# Patient Record
Sex: Male | Born: 1965
Health system: Southern US, Community
[De-identification: ages and names within clinical notes are randomized; demographics above are authoritative.]

## PROBLEM LIST (undated history)

## (undated) DIAGNOSIS — E785 Hyperlipidemia, unspecified: Secondary | ICD-10-CM

## (undated) DIAGNOSIS — I10 Essential (primary) hypertension: Secondary | ICD-10-CM

## (undated) DIAGNOSIS — I255 Ischemic cardiomyopathy: Secondary | ICD-10-CM

## (undated) DIAGNOSIS — I2510 Atherosclerotic heart disease of native coronary artery without angina pectoris: Secondary | ICD-10-CM

## (undated) HISTORY — PX: OTHER SURGICAL HISTORY: SHX169

## (undated) HISTORY — DX: Atherosclerotic heart disease of native coronary artery without angina pectoris: I25.10

## (undated) HISTORY — DX: Ischemic cardiomyopathy: I25.5

## (undated) HISTORY — PX: ABDOMINAL SURGERY: SHX537

## (undated) HISTORY — DX: Hyperlipidemia, unspecified: E78.5

---

## 2009-01-10 ENCOUNTER — Emergency Department (HOSPITAL_COMMUNITY): Admission: EM | Admit: 2009-01-10 | Discharge: 2009-01-10 | Payer: Self-pay | Admitting: Emergency Medicine

## 2011-05-26 ENCOUNTER — Inpatient Hospital Stay (INDEPENDENT_AMBULATORY_CARE_PROVIDER_SITE_OTHER)
Admission: RE | Admit: 2011-05-26 | Discharge: 2011-05-26 | Disposition: A | Discharge status: Home / Self Care | Payer: PRIVATE HEALTH INSURANCE | Source: Ambulatory Visit | Attending: Emergency Medicine | Admitting: Emergency Medicine

## 2011-05-26 DIAGNOSIS — I10 Essential (primary) hypertension: Secondary | ICD-10-CM

## 2011-05-26 DIAGNOSIS — M799 Soft tissue disorder, unspecified: Secondary | ICD-10-CM

## 2011-05-26 DIAGNOSIS — K089 Disorder of teeth and supporting structures, unspecified: Secondary | ICD-10-CM

## 2011-06-19 ENCOUNTER — Emergency Department (INDEPENDENT_AMBULATORY_CARE_PROVIDER_SITE_OTHER)
Admission: EM | Admit: 2011-06-19 | Discharge: 2011-06-19 | Disposition: A | Discharge status: Home / Self Care | Payer: PRIVATE HEALTH INSURANCE | Source: Home / Self Care | Attending: Family Medicine | Admitting: Family Medicine

## 2011-06-19 ENCOUNTER — Encounter: Payer: Self-pay | Admitting: Emergency Medicine

## 2011-06-19 DIAGNOSIS — R51 Headache: Secondary | ICD-10-CM

## 2011-06-19 DIAGNOSIS — M542 Cervicalgia: Secondary | ICD-10-CM

## 2011-06-19 DIAGNOSIS — I10 Essential (primary) hypertension: Secondary | ICD-10-CM

## 2011-06-19 HISTORY — DX: Essential (primary) hypertension: I10

## 2011-06-19 LAB — POCT I-STAT, CHEM 8
Creatinine, Ser: 1.1 mg/dL (ref 0.50–1.35)
Hemoglobin: 14.6 g/dL (ref 13.0–17.0)
Potassium: 3.8 mEq/L (ref 3.5–5.1)
Sodium: 143 mEq/L (ref 135–145)

## 2011-06-19 MED ORDER — HYDROCHLOROTHIAZIDE 25 MG PO TABS: 25.0000 mg | ORAL_TABLET | Freq: Every day | ORAL | Status: DC

## 2011-06-19 MED ORDER — HYDROCODONE-ACETAMINOPHEN 5-325 MG PO TABS: ORAL_TABLET | ORAL | Status: DC

## 2011-06-19 NOTE — ED Provider Notes (Addendum)
History     CSN: [id number] Arrival date & time: 06/19/2011  3:40 PM   First MD Initiated Contact with Patient 06/19/11 1542      Chief Complaint  Patient presents with  . Torticollis    (Consider location/radiation/quality/duration/timing/severity/associated sxs/prior treatment) The history is provided by the patient.  patient reports 3 day hx  Of headache and neck pain. No fever. No cough or cold symptoms. Gaylyn Rong is throbbing. No blurred vision. No n/v. No weakness. Denies head injury   Past Medical History  Diagnosis Date  . Hypertension     History reviewed. No pertinent past surgical history.  History reviewed. No pertinent family history.  History  Substance Use Topics  . Smoking status: Current Everyday Smoker  . Smokeless tobacco: Not on file  . Alcohol Use: No      Review of Systems  Constitutional: Negative.   HENT: Positive for neck stiffness.   Respiratory: Negative.   Cardiovascular: Negative.   Gastrointestinal: Negative.   Neurological: Positive for headaches. Negative for dizziness, weakness and numbness.    Allergies  Aspirin  Home Medications   Current Outpatient Rx  Name Route Sig Dispense Refill  . HYDROCHLOROTHIAZIDE 25 MG PO TABS Oral Take 1 tablet (25 mg total) by mouth daily. 20 tablet 1  . HYDROCODONE-ACETAMINOPHEN 5-325 MG PO TABS  1-2 tabs every 8 hrs prn pain 10 tablet 0    BP 189/99  Pulse 75  Temp(Src) 98.3 F (36.8 C) (Oral)  Resp 20  SpO2 99%  Physical Exam  Nursing note and vitals reviewed. Constitutional: He is oriented to person, place, and time. He appears well-developed and well-nourished.  HENT:  Head: Normocephalic and atraumatic.  Right Ear: External ear normal.  Nose: Nose normal.  Mouth/Throat: Oropharynx is clear and moist.  Eyes: Conjunctivae and EOM are normal. Pupils are equal, round, and reactive to light.  Neck: Neck supple.       Pain with rom. Skin clear  Cardiovascular: Normal rate and regular  rhythm.   Pulmonary/Chest: Effort normal and breath sounds normal.  Abdominal: Soft. Bowel sounds are normal.  Musculoskeletal: Normal range of motion.  Neurological: He is alert and oriented to person, place, and time. He has normal reflexes. No cranial nerve deficit. Coordination normal.  Skin: Skin is warm and dry.  Psychiatric: He has a normal mood and affect.    ED Course  Procedures (including critical care time)  Labs Reviewed  POCT I-STAT, CHEM 8 - Abnormal; Notable for the following:    Glucose, Bld 131 (*)    All other components within normal limits  I-STAT, CHEM 8   No results found.   1. Hypertension   2. Headache   3. Neck pain       MDM          Randa Spike, MD 06/19/11 1659  Randa Spike, MD 06/19/11 1700
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
## 2012-05-20 ENCOUNTER — Encounter (HOSPITAL_COMMUNITY): Payer: Self-pay | Admitting: Emergency Medicine

## 2012-05-20 ENCOUNTER — Emergency Department (INDEPENDENT_AMBULATORY_CARE_PROVIDER_SITE_OTHER)
Admission: EM | Admit: 2012-05-20 | Discharge: 2012-05-20 | Disposition: A | Discharge status: Home / Self Care | Payer: Medicaid - Out of State | Source: Home / Self Care

## 2012-05-20 DIAGNOSIS — M549 Dorsalgia, unspecified: Secondary | ICD-10-CM

## 2012-05-20 DIAGNOSIS — M542 Cervicalgia: Secondary | ICD-10-CM

## 2012-05-20 MED ORDER — CYCLOBENZAPRINE HCL 10 MG PO TABS: 10.0000 mg | ORAL_TABLET | Freq: Two times a day (BID) | ORAL | Status: DC | PRN

## 2012-05-20 MED ORDER — METHYLPREDNISOLONE 4 MG PO KIT: PACK | ORAL | Status: DC

## 2012-05-20 MED ORDER — KETOROLAC TROMETHAMINE 60 MG/2ML IM SOLN
INTRAMUSCULAR | Status: AC
  Filled 2012-05-20: qty 2

## 2012-05-20 MED ORDER — KETOROLAC TROMETHAMINE 60 MG/2ML IM SOLN
60.0000 mg | Freq: Once | INTRAMUSCULAR | Status: AC
  Administered 2012-05-20: 60 mg via INTRAMUSCULAR

## 2012-05-20 MED ORDER — NAPROXEN 500 MG PO TABS: 500.0000 mg | ORAL_TABLET | Freq: Two times a day (BID) | ORAL | Status: DC

## 2012-05-20 NOTE — ED Notes (Signed)
Pt c/o chest pain that radiates to back most pain felt in chest with deep breathing. Symptoms have been constant for the past two weeks.  Muscle and joint stiffness. Pt denies sob

## 2012-05-20 NOTE — ED Provider Notes (Signed)
History     CSN: [id number]  Arrival date & time 05/20/12  1057   None     Chief Complaint  Patient presents with  . Chest Pain  . Back Pain    (Consider location/radiation/quality/duration/timing/severity/associated sxs/prior treatment) Patient is a 46 y.o. male presenting with back pain. The history is provided by the patient.  Back Pain  This is a recurrent problem. The current episode started more than 1 week ago. The problem occurs daily. The problem has not changed since onset.The pain is associated with no known injury. The pain is present in the lumbar spine and thoracic spine. The quality of the pain is described as aching. The pain does not radiate. The pain is moderate. The symptoms are aggravated by bending and certain positions. The pain is worse during the day. Stiffness is present in the morning. Associated symptoms include chest pain. Pertinent negatives include no bowel incontinence, no perianal numbness, no dysuria, no leg pain, no paresthesias, no tingling and no weakness. He has tried NSAIDs for the symptoms. The treatment provided mild relief. Risk factors include lack of exercise.  back pain body stiffness, central chest pain worse with movement for one week, advil taken with minor relief.  No illness, lifts for work-boxes.    Past Medical History  Diagnosis Date  . Hypertension     History reviewed. No pertinent past surgical history.  History reviewed. No pertinent family history.  History  Substance Use Topics  . Smoking status: Current Every Day Smoker  . Smokeless tobacco: Not on file  . Alcohol Use: No      Review of Systems  Constitutional: Negative.   Cardiovascular: Positive for chest pain.  Gastrointestinal: Negative for bowel incontinence.  Genitourinary: Negative for dysuria.  Musculoskeletal: Positive for back pain.  Neurological: Negative for tingling, weakness and paresthesias.    Allergies  Aspirin  Home Medications   Current  Outpatient Rx  Name Route Sig Dispense Refill  . CYCLOBENZAPRINE HCL 10 MG PO TABS Oral Take 1 tablet (10 mg total) by mouth 2 (two) times daily as needed for muscle spasms. 20 tablet 0  . HYDROCHLOROTHIAZIDE 25 MG PO TABS Oral Take 1 tablet (25 mg total) by mouth daily. 20 tablet 1  . HYDROCODONE-ACETAMINOPHEN 5-325 MG PO TABS  1-2 tabs every 8 hrs prn pain 10 tablet 0  . METHYLPREDNISOLONE 4 MG PO KIT  Take as directed 21 tablet 0  . NAPROXEN 500 MG PO TABS Oral Take 1 tablet (500 mg total) by mouth 2 (two) times daily. 30 tablet 0    BP 150/90  Pulse 64  Temp 98 F (36.7 C) (Oral)  Resp 18  SpO2 100%  Physical Exam  Nursing note and vitals reviewed. Constitutional: He is oriented to person, place, and time. Vital signs are normal. He appears well-developed and well-nourished. He is active and cooperative.  HENT:  Head: Normocephalic.  Eyes: Conjunctivae normal are normal. Pupils are equal, round, and reactive to light. No scleral icterus.  Neck: Trachea normal. Neck supple.  Cardiovascular: Normal rate and regular rhythm.   Pulmonary/Chest: Effort normal and breath sounds normal.  Musculoskeletal:       Cervical back: Normal.       Thoracic back: Normal.       Lumbar back: Normal.       Thoracic and lumbar paraspinal tenderness, straight leg raises negative.  Neurological: He is alert and oriented to person, place, and time. He has normal strength. No cranial nerve  deficit or sensory deficit. Coordination and gait normal. GCS eye subscore is 4. GCS verbal subscore is 5. GCS motor subscore is 6.       MAEx 4, strength equal bilaterally  Skin: Skin is warm and dry.  Psychiatric: He has a normal mood and affect. His speech is normal and behavior is normal. Judgment and thought content normal. Cognition and memory are normal.    ED Course  Procedures (including critical care time)  Labs Reviewed - No data to display No results found.   1. Neck pain   2. Back pain        MDM  toradol 60mg  Im administered.  Medication as prescribed.  Follow up with primary care provider for long term management.          Johnsie Kindred, NP 05/22/12 1744

## 2012-05-23 NOTE — ED Provider Notes (Signed)
Medical screening examination/treatment/procedure(s) were performed by non-physician practitioner and as supervising physician I was immediately available for consultation/collaboration.  Cate Oravec, M.D.   Adrick Kestler C Kevonta Phariss, MD 05/23/12 2144 

## 2013-12-22 ENCOUNTER — Emergency Department (INDEPENDENT_AMBULATORY_CARE_PROVIDER_SITE_OTHER)
Admission: EM | Admit: 2013-12-22 | Discharge: 2013-12-22 | Disposition: A | Discharge status: Home / Self Care | Payer: Self-pay | Source: Home / Self Care | Attending: Family Medicine | Admitting: Family Medicine

## 2013-12-22 ENCOUNTER — Encounter (HOSPITAL_COMMUNITY): Payer: Self-pay | Admitting: Emergency Medicine

## 2013-12-22 DIAGNOSIS — S060XAA Concussion with loss of consciousness status unknown, initial encounter: Secondary | ICD-10-CM

## 2013-12-22 DIAGNOSIS — S060X9A Concussion with loss of consciousness of unspecified duration, initial encounter: Secondary | ICD-10-CM

## 2013-12-22 DIAGNOSIS — S139XXA Sprain of joints and ligaments of unspecified parts of neck, initial encounter: Secondary | ICD-10-CM

## 2013-12-22 DIAGNOSIS — S134XXA Sprain of ligaments of cervical spine, initial encounter: Secondary | ICD-10-CM

## 2013-12-22 MED ORDER — HYDROCODONE-ACETAMINOPHEN 5-325 MG PO TABS: 1.0000 | ORAL_TABLET | Freq: Four times a day (QID) | ORAL | Status: DC | PRN

## 2013-12-22 MED ORDER — IBUPROFEN 800 MG PO TABS: 800.0000 mg | ORAL_TABLET | Freq: Three times a day (TID) | ORAL | Status: DC

## 2013-12-22 MED ORDER — CYCLOBENZAPRINE HCL 10 MG PO TABS: 10.0000 mg | ORAL_TABLET | Freq: Three times a day (TID) | ORAL | Status: DC | PRN
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
## 2013-12-26 NOTE — ED Provider Notes (Signed)
Medical screening examination/treatment/procedure(s) were performed by a resident physician or non-physician practitioner and as the supervising physician I was immediately available for consultation/collaboration.  Evan Corey, MD    Evan S Corey, MD 12/26/13 0926 

## 2014-05-15 ENCOUNTER — Emergency Department (HOSPITAL_COMMUNITY)
Admission: EM | Admit: 2014-05-15 | Discharge: 2014-05-15 | Disposition: A | Discharge status: Home / Self Care | Payer: Self-pay | Attending: Emergency Medicine | Admitting: Emergency Medicine

## 2014-05-15 ENCOUNTER — Encounter (HOSPITAL_COMMUNITY): Payer: Self-pay | Admitting: Emergency Medicine

## 2014-05-15 DIAGNOSIS — R05 Cough: Secondary | ICD-10-CM | POA: Insufficient documentation

## 2014-05-15 DIAGNOSIS — Z720 Tobacco use: Secondary | ICD-10-CM | POA: Insufficient documentation

## 2014-05-15 DIAGNOSIS — S3992XA Unspecified injury of lower back, initial encounter: Secondary | ICD-10-CM | POA: Insufficient documentation

## 2014-05-15 DIAGNOSIS — Z79899 Other long term (current) drug therapy: Secondary | ICD-10-CM | POA: Insufficient documentation

## 2014-05-15 DIAGNOSIS — Y9389 Activity, other specified: Secondary | ICD-10-CM | POA: Insufficient documentation

## 2014-05-15 DIAGNOSIS — I10 Essential (primary) hypertension: Secondary | ICD-10-CM | POA: Insufficient documentation

## 2014-05-15 DIAGNOSIS — S199XXA Unspecified injury of neck, initial encounter: Secondary | ICD-10-CM | POA: Insufficient documentation

## 2014-05-15 DIAGNOSIS — Y92410 Unspecified street and highway as the place of occurrence of the external cause: Secondary | ICD-10-CM | POA: Insufficient documentation

## 2014-05-15 DIAGNOSIS — Z791 Long term (current) use of non-steroidal anti-inflammatories (NSAID): Secondary | ICD-10-CM | POA: Insufficient documentation

## 2014-05-15 DIAGNOSIS — S4990XA Unspecified injury of shoulder and upper arm, unspecified arm, initial encounter: Secondary | ICD-10-CM | POA: Insufficient documentation

## 2014-05-15 DIAGNOSIS — M25511 Pain in right shoulder: Secondary | ICD-10-CM

## 2014-05-15 MED ORDER — OXYCODONE-ACETAMINOPHEN 5-325 MG PO TABS
2.0000 | ORAL_TABLET | Freq: Once | ORAL | Status: AC
  Administered 2014-05-15: 2 via ORAL
  Filled 2014-05-15: qty 2

## 2014-05-15 MED ORDER — DIAZEPAM 5 MG PO TABS: 5.0000 mg | ORAL_TABLET | Freq: Two times a day (BID) | ORAL | Status: DC

## 2014-05-15 NOTE — ED Notes (Signed)
After Pt received Pain meds and  does not want to wait for x-ray of RT shoulder. Pt reported he wanted to go to his medical DR for follow up.
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
## 2014-05-15 NOTE — Discharge Instructions (Signed)

## 2014-05-16 NOTE — Discharge Planning (Signed)
[facility] was unable to see the patient, [facility] orange card information will be sent to the address provided. °

## 2014-05-21 NOTE — ED Provider Notes (Signed)
Medical screening examination/treatment/procedure(s) were performed by non-physician practitioner and as supervising physician I was immediately available for consultation/collaboration.   EKG Interpretation None        Rolland PorterMark Wendell Nicoson, MD 05/21/14 [phone]

## 2015-01-16 ENCOUNTER — Emergency Department (HOSPITAL_COMMUNITY)
Admission: EM | Admit: 2015-01-16 | Discharge: 2015-01-16 | Disposition: A | Discharge status: Home / Self Care | Payer: Self-pay | Attending: Emergency Medicine | Admitting: Emergency Medicine

## 2015-01-16 ENCOUNTER — Encounter (HOSPITAL_COMMUNITY): Payer: Self-pay | Admitting: Emergency Medicine

## 2015-01-16 DIAGNOSIS — Z720 Tobacco use: Secondary | ICD-10-CM | POA: Insufficient documentation

## 2015-01-16 DIAGNOSIS — M542 Cervicalgia: Secondary | ICD-10-CM

## 2015-01-16 DIAGNOSIS — Y92410 Unspecified street and highway as the place of occurrence of the external cause: Secondary | ICD-10-CM | POA: Insufficient documentation

## 2015-01-16 DIAGNOSIS — T148XXA Other injury of unspecified body region, initial encounter: Secondary | ICD-10-CM

## 2015-01-16 DIAGNOSIS — Z79899 Other long term (current) drug therapy: Secondary | ICD-10-CM | POA: Insufficient documentation

## 2015-01-16 DIAGNOSIS — S299XXA Unspecified injury of thorax, initial encounter: Secondary | ICD-10-CM | POA: Insufficient documentation

## 2015-01-16 DIAGNOSIS — Y9389 Activity, other specified: Secondary | ICD-10-CM | POA: Insufficient documentation

## 2015-01-16 DIAGNOSIS — Y998 Other external cause status: Secondary | ICD-10-CM | POA: Insufficient documentation

## 2015-01-16 DIAGNOSIS — S199XXA Unspecified injury of neck, initial encounter: Secondary | ICD-10-CM | POA: Insufficient documentation

## 2015-01-16 DIAGNOSIS — I10 Essential (primary) hypertension: Secondary | ICD-10-CM | POA: Insufficient documentation

## 2015-01-16 DIAGNOSIS — T148 Other injury of unspecified body region: Secondary | ICD-10-CM | POA: Insufficient documentation

## 2015-01-16 MED ORDER — CYCLOBENZAPRINE HCL 10 MG PO TABS: 10.0000 mg | ORAL_TABLET | Freq: Two times a day (BID) | ORAL | Status: DC | PRN

## 2015-01-16 MED ORDER — IBUPROFEN 800 MG PO TABS: 800.0000 mg | ORAL_TABLET | Freq: Three times a day (TID) | ORAL | Status: DC
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
## 2015-01-16 NOTE — Discharge Instructions (Signed)
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
## 2015-01-16 NOTE — ED Notes (Signed)
Lower back, neck and entire spine.  Right leg pain in upper leg/hip.

## 2018-04-03 ENCOUNTER — Telehealth (HOSPITAL_COMMUNITY): Payer: Self-pay | Admitting: *Deleted

## 2018-04-03 ENCOUNTER — Ambulatory Visit (HOSPITAL_COMMUNITY)
Admission: EM | Admit: 2018-04-03 | Discharge: 2018-04-03 | Disposition: A | Discharge status: Home / Self Care | Payer: Self-pay | Attending: Family Medicine | Admitting: Family Medicine

## 2018-04-03 ENCOUNTER — Encounter (HOSPITAL_COMMUNITY): Payer: Self-pay | Admitting: *Deleted

## 2018-04-03 ENCOUNTER — Other Ambulatory Visit: Payer: Self-pay

## 2018-04-03 DIAGNOSIS — M545 Low back pain, unspecified: Secondary | ICD-10-CM

## 2018-04-03 DIAGNOSIS — M546 Pain in thoracic spine: Secondary | ICD-10-CM

## 2018-04-03 MED ORDER — MELOXICAM 7.5 MG PO TABS: 7.5000 mg | ORAL_TABLET | Freq: Every day | ORAL | 0 refills | Status: DC

## 2018-04-03 MED ORDER — KETOROLAC TROMETHAMINE 30 MG/ML IJ SOLN
INTRAMUSCULAR | Status: AC
  Filled 2018-04-03: qty 1

## 2018-04-03 MED ORDER — KETOROLAC TROMETHAMINE 30 MG/ML IJ SOLN
30.0000 mg | Freq: Once | INTRAMUSCULAR | Status: AC
  Administered 2018-04-03: 30 mg via INTRAMUSCULAR

## 2018-04-03 MED ORDER — METHOCARBAMOL 500 MG PO TABS: 500.0000 mg | ORAL_TABLET | Freq: Two times a day (BID) | ORAL | 0 refills | Status: DC

## 2018-04-03 NOTE — Discharge Instructions (Signed)
No alarming signs on your exam. Toradol injection in office today. Start Mobic. Do not take ibuprofen (motrin/advil)/ naproxen (aleve) while on mobic. Robaxin as needed, this can make you drowsy, so do not take if you are going to drive, operate heavy machinery, or make important decisions. Ice/heat compresses as needed. This can take up to 3-4 weeks to completely resolve, but you should be feeling better each week. Follow up here or with PCP if symptoms worsen, changes for reevaluation.   Back  If experience numbness/tingling of the inner thighs, loss of bladder or bowel control, go to the emergency department for evaluation.   Head If experiencing worsening of symptoms, headache/blurry vision, nausea/vomiting, confusion/altered mental status, dizziness, weakness, passing out, imbalance, go to the emergency department for further evaluation.
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
## 2019-11-04 ENCOUNTER — Ambulatory Visit (HOSPITAL_COMMUNITY)
Admission: EM | Admit: 2019-11-04 | Discharge: 2019-11-04 | Disposition: A | Discharge status: Home / Self Care | Payer: Self-pay | Attending: Family Medicine | Admitting: Family Medicine

## 2019-11-04 ENCOUNTER — Other Ambulatory Visit: Payer: Self-pay

## 2019-11-04 ENCOUNTER — Encounter (HOSPITAL_COMMUNITY): Payer: Self-pay

## 2019-11-04 DIAGNOSIS — M542 Cervicalgia: Secondary | ICD-10-CM

## 2019-11-04 DIAGNOSIS — S46811A Strain of other muscles, fascia and tendons at shoulder and upper arm level, right arm, initial encounter: Secondary | ICD-10-CM

## 2019-11-04 DIAGNOSIS — M546 Pain in thoracic spine: Secondary | ICD-10-CM

## 2019-11-04 MED ORDER — CYCLOBENZAPRINE HCL 10 MG PO TABS: 10.0000 mg | ORAL_TABLET | Freq: Two times a day (BID) | ORAL | 0 refills | Status: DC | PRN

## 2019-11-04 MED ORDER — KETOROLAC TROMETHAMINE 30 MG/ML IJ SOLN
INTRAMUSCULAR | Status: AC
  Filled 2019-11-04: qty 1

## 2019-11-04 MED ORDER — KETOROLAC TROMETHAMINE 30 MG/ML IJ SOLN
30.0000 mg | Freq: Once | INTRAMUSCULAR | Status: AC
  Administered 2019-11-04: 30 mg via INTRAMUSCULAR
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
## 2019-12-05 ENCOUNTER — Other Ambulatory Visit: Payer: Self-pay

## 2019-12-05 ENCOUNTER — Encounter (HOSPITAL_COMMUNITY): Payer: Self-pay | Admitting: *Deleted

## 2019-12-05 ENCOUNTER — Inpatient Hospital Stay (HOSPITAL_COMMUNITY)
Admission: EM | Admit: 2019-12-05 | Discharge: 2019-12-07 | DRG: 247 | Disposition: A | Discharge status: Home / Self Care | Payer: Self-pay | Attending: Interventional Cardiology | Admitting: Interventional Cardiology

## 2019-12-05 ENCOUNTER — Emergency Department (HOSPITAL_COMMUNITY): Payer: Self-pay

## 2019-12-05 DIAGNOSIS — Z955 Presence of coronary angioplasty implant and graft: Secondary | ICD-10-CM

## 2019-12-05 DIAGNOSIS — Z791 Long term (current) use of non-steroidal anti-inflammatories (NSAID): Secondary | ICD-10-CM

## 2019-12-05 DIAGNOSIS — Z20822 Contact with and (suspected) exposure to covid-19: Secondary | ICD-10-CM | POA: Diagnosis present

## 2019-12-05 DIAGNOSIS — I10 Essential (primary) hypertension: Secondary | ICD-10-CM | POA: Diagnosis present

## 2019-12-05 DIAGNOSIS — E785 Hyperlipidemia, unspecified: Secondary | ICD-10-CM | POA: Diagnosis present

## 2019-12-05 DIAGNOSIS — Z833 Family history of diabetes mellitus: Secondary | ICD-10-CM

## 2019-12-05 DIAGNOSIS — I2510 Atherosclerotic heart disease of native coronary artery without angina pectoris: Secondary | ICD-10-CM | POA: Diagnosis present

## 2019-12-05 DIAGNOSIS — R7303 Prediabetes: Secondary | ICD-10-CM | POA: Diagnosis present

## 2019-12-05 DIAGNOSIS — Z79899 Other long term (current) drug therapy: Secondary | ICD-10-CM

## 2019-12-05 DIAGNOSIS — Z886 Allergy status to analgesic agent status: Secondary | ICD-10-CM

## 2019-12-05 DIAGNOSIS — F17210 Nicotine dependence, cigarettes, uncomplicated: Secondary | ICD-10-CM | POA: Diagnosis present

## 2019-12-05 DIAGNOSIS — I213 ST elevation (STEMI) myocardial infarction of unspecified site: Secondary | ICD-10-CM

## 2019-12-05 DIAGNOSIS — I255 Ischemic cardiomyopathy: Secondary | ICD-10-CM | POA: Diagnosis present

## 2019-12-05 DIAGNOSIS — Z720 Tobacco use: Secondary | ICD-10-CM

## 2019-12-05 DIAGNOSIS — I2129 ST elevation (STEMI) myocardial infarction involving other sites: Principal | ICD-10-CM | POA: Diagnosis present

## 2019-12-05 LAB — BASIC METABOLIC PANEL
Anion gap: 9 (ref 5–15)
BUN: 13 mg/dL (ref 6–20)
CO2: 32 mmol/L (ref 22–32)
Calcium: 9.3 mg/dL (ref 8.9–10.3)
Chloride: 102 mmol/L (ref 98–111)
Creatinine, Ser: 1.07 mg/dL (ref 0.61–1.24)
GFR calc Af Amer: >60 mL/min (ref >60)
GFR calc non Af Amer: >60 mL/min (ref >60)
Glucose, Bld: 112 mg/dL — ABNORMAL HIGH (ref 70–99)
Potassium: 3.9 mmol/L (ref 3.5–5.1)
Sodium: 143 mmol/L (ref 135–145)

## 2019-12-05 LAB — TROPONIN I (HIGH SENSITIVITY): Troponin I (High Sensitivity): 233 ng/L (ref <18)

## 2019-12-05 LAB — CBC
HCT: 41.3 % (ref 39.0–52.0)
Hemoglobin: 13.7 g/dL (ref 13.0–17.0)
MCH: 27.3 pg (ref 26.0–34.0)
MCHC: 33.2 g/dL (ref 30.0–36.0)
MCV: 82.4 fL (ref 80.0–100.0)
Platelets: 310 10*3/uL (ref 150–400)
RBC: 5.01 MIL/uL (ref 4.22–5.81)
RDW: 15.8 % — ABNORMAL HIGH (ref 11.5–15.5)
WBC: 5.7 10*3/uL (ref 4.0–10.5)
nRBC: 0 % (ref 0.0–0.2)

## 2019-12-05 MED ORDER — ASPIRIN 81 MG PO CHEW
243.0000 mg | CHEWABLE_TABLET | Freq: Once | ORAL | Status: AC
  Administered 2019-12-05: 243 mg via ORAL
  Filled 2019-12-05: qty 3

## 2019-12-05 MED ORDER — NITROGLYCERIN 0.4 MG SL SUBL: 0.4000 mg | SUBLINGUAL_TABLET | SUBLINGUAL | Status: DC | PRN

## 2019-12-05 MED ORDER — HEPARIN SODIUM (PORCINE) 5000 UNIT/ML IJ SOLN
4000.0000 [IU] | Freq: Once | INTRAMUSCULAR | Status: AC
  Administered 2019-12-05: 4000 [IU] via INTRAVENOUS
  Filled 2019-12-05: qty 1

## 2019-12-05 MED ORDER — SODIUM CHLORIDE 0.9% FLUSH
3.0000 mL | Freq: Once | INTRAVENOUS | Status: AC
  Administered 2019-12-05: 3 mL via INTRAVENOUS

## 2019-12-05 MED ORDER — HEPARIN (PORCINE) 25000 UT/250ML-% IV SOLN
1200.0000 [IU]/h | INTRAVENOUS | Status: DC
  Administered 2019-12-05: 1200 [IU]/h via INTRAVENOUS
  Filled 2019-12-05: qty 250

## 2019-12-05 NOTE — ED Triage Notes (Signed)
Pt says that he has been having shortness of breath, feels like his heart is racing,  and generalized chest pain that started last night.
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
## 2019-12-05 NOTE — Progress Notes (Signed)
ANTICOAGULATION CONSULT NOTE - Initial Consult  Pharmacy Consult for Heparin Indication: chest pain/ACS  Allergies  Allergen Reactions  . Aspirin     Hives     Patient Measurements: Height: 5\' 10"  (177.8 cm) Weight: 97.5 kg (215 lb) IBW/kg (Calculated) : 73 Heparin Dosing Weight:   Vital Signs: Temp: 98.5 F (36.9 C) (05/03 2121) Temp Source: Oral (05/03 2121) BP: 172/93 (05/03 2340) Pulse Rate: 63 (05/03 2340)  Labs: Recent Labs    12/05/19 2133  HGB 13.7  HCT 41.3  PLT 310  CREATININE 1.07  TROPONINIHS 233*    Estimated Creatinine Clearance: 92.4 mL/min (by C-G formula based on SCr of 1.07 mg/dL).   Medical History: Past Medical History:  Diagnosis Date  . Hypertension     Medications:  (Not in a hospital admission)  Infusions:  . [START ON 12/06/2019] heparin      Assessment: Patient with ACS.  No oral anticoagulants noted on med rec.  Baseline coags ordered/drawn.  Heparin 4000 units iv bolus ordered/charted.    Goal of Therapy:  Heparin level 0.3-0.7 units/ml Monitor platelets by anticoagulation protocol: Yes   Plan:  Heparin 1200 units/hr CBC daily Heparin level at 0800 5/4  02/05/2020 [place] 12/05/2019,11:47 PM

## 2019-12-06 ENCOUNTER — Inpatient Hospital Stay (HOSPITAL_COMMUNITY): Payer: Self-pay

## 2019-12-06 ENCOUNTER — Encounter (HOSPITAL_COMMUNITY)
Admission: EM | Disposition: A | Discharge status: Home / Self Care | Payer: Self-pay | Source: Home / Self Care | Attending: Interventional Cardiology

## 2019-12-06 ENCOUNTER — Other Ambulatory Visit: Payer: Self-pay

## 2019-12-06 DIAGNOSIS — I2129 ST elevation (STEMI) myocardial infarction involving other sites: Principal | ICD-10-CM

## 2019-12-06 DIAGNOSIS — I213 ST elevation (STEMI) myocardial infarction of unspecified site: Secondary | ICD-10-CM

## 2019-12-06 DIAGNOSIS — I2510 Atherosclerotic heart disease of native coronary artery without angina pectoris: Secondary | ICD-10-CM

## 2019-12-06 DIAGNOSIS — Z720 Tobacco use: Secondary | ICD-10-CM

## 2019-12-06 DIAGNOSIS — I2121 ST elevation (STEMI) myocardial infarction involving left circumflex coronary artery: Secondary | ICD-10-CM

## 2019-12-06 DIAGNOSIS — I10 Essential (primary) hypertension: Secondary | ICD-10-CM

## 2019-12-06 HISTORY — PX: CORONARY STENT INTERVENTION: CATH118234

## 2019-12-06 HISTORY — PX: CORONARY BALLOON ANGIOPLASTY: CATH118233

## 2019-12-06 HISTORY — PX: LEFT HEART CATH AND CORONARY ANGIOGRAPHY: CATH118249

## 2019-12-06 HISTORY — PX: CORONARY/GRAFT ACUTE MI REVASCULARIZATION: CATH118305

## 2019-12-06 LAB — HIV ANTIBODY (ROUTINE TESTING W REFLEX): HIV Screen 4th Generation wRfx: NONREACTIVE

## 2019-12-06 LAB — CBC
HCT: 40.7 % (ref 39.0–52.0)
Hemoglobin: 13.3 g/dL (ref 13.0–17.0)
MCH: 26.8 pg (ref 26.0–34.0)
MCHC: 32.7 g/dL (ref 30.0–36.0)
MCV: 82.1 fL (ref 80.0–100.0)
Platelets: 303 10*3/uL (ref 150–400)
RBC: 4.96 MIL/uL (ref 4.22–5.81)
RDW: 15.7 % — ABNORMAL HIGH (ref 11.5–15.5)
WBC: 7.7 10*3/uL (ref 4.0–10.5)
nRBC: 0 % (ref 0.0–0.2)

## 2019-12-06 LAB — RESPIRATORY PANEL BY RT PCR (FLU A&B, COVID)
Influenza A by PCR: NEGATIVE
Influenza B by PCR: NEGATIVE
SARS Coronavirus 2 by RT PCR: NEGATIVE

## 2019-12-06 LAB — LIPID PANEL
Cholesterol: 214 mg/dL — ABNORMAL HIGH (ref 0–200)
HDL: 45 mg/dL (ref >40)
LDL Cholesterol: 164 mg/dL — ABNORMAL HIGH (ref 0–99)
Total CHOL/HDL Ratio: 4.8 RATIO
Triglycerides: 27 mg/dL (ref <150)
VLDL: 5 mg/dL (ref 0–40)

## 2019-12-06 LAB — TROPONIN I (HIGH SENSITIVITY)
Troponin I (High Sensitivity): 141 ng/L (ref <18)
Troponin I (High Sensitivity): >27000 ng/L (ref <18)

## 2019-12-06 LAB — BASIC METABOLIC PANEL
Anion gap: 8 (ref 5–15)
BUN: 9 mg/dL (ref 6–20)
CO2: 29 mmol/L (ref 22–32)
Calcium: 9.1 mg/dL (ref 8.9–10.3)
Chloride: 102 mmol/L (ref 98–111)
Creatinine, Ser: 1.01 mg/dL (ref 0.61–1.24)
GFR calc Af Amer: >60 mL/min (ref >60)
GFR calc non Af Amer: >60 mL/min (ref >60)
Glucose, Bld: 132 mg/dL — ABNORMAL HIGH (ref 70–99)
Potassium: 3.7 mmol/L (ref 3.5–5.1)
Sodium: 139 mmol/L (ref 135–145)

## 2019-12-06 LAB — MRSA PCR SCREENING: MRSA by PCR: NEGATIVE

## 2019-12-06 LAB — PROTIME-INR
INR: 1 (ref 0.8–1.2)
INR: 1.1 (ref 0.8–1.2)
Prothrombin Time: 12.4 seconds (ref 11.4–15.2)
Prothrombin Time: 13.5 seconds (ref 11.4–15.2)

## 2019-12-06 LAB — MAGNESIUM: Magnesium: 2 mg/dL (ref 1.7–2.4)

## 2019-12-06 LAB — ECHOCARDIOGRAM COMPLETE
Height: 70 in
Weight: 3280.44 oz

## 2019-12-06 LAB — BRAIN NATRIURETIC PEPTIDE: B Natriuretic Peptide: 21.9 pg/mL (ref 0.0–100.0)

## 2019-12-06 LAB — POCT ACTIVATED CLOTTING TIME
Activated Clotting Time: 279 seconds
Activated Clotting Time: 296 seconds

## 2019-12-06 LAB — D-DIMER, QUANTITATIVE: D-Dimer, Quant: 0.54 ug/mL-FEU — ABNORMAL HIGH (ref 0.00–0.50)

## 2019-12-06 LAB — HEMOGLOBIN A1C
Hgb A1c MFr Bld: 6.2 % — ABNORMAL HIGH (ref 4.8–5.6)
Mean Plasma Glucose: 131.24 mg/dL

## 2019-12-06 LAB — APTT: aPTT: 26 seconds (ref 24–36)

## 2019-12-06 SURGERY — CORONARY/GRAFT ACUTE MI REVASCULARIZATION
Anesthesia: LOCAL

## 2019-12-06 MED ORDER — CHLORHEXIDINE GLUCONATE CLOTH 2 % EX PADS
6.0000 | MEDICATED_PAD | Freq: Every day | CUTANEOUS | Status: DC
  Administered 2019-12-06: 22:00:00 6 via TOPICAL

## 2019-12-06 MED ORDER — NITROGLYCERIN 1 MG/10 ML FOR IR/CATH LAB
INTRA_ARTERIAL | Status: AC
  Filled 2019-12-06: qty 10

## 2019-12-06 MED ORDER — ONDANSETRON HCL 4 MG/2ML IJ SOLN: 4.0000 mg | Freq: Four times a day (QID) | INTRAMUSCULAR | Status: DC | PRN

## 2019-12-06 MED ORDER — SODIUM CHLORIDE 0.9 % IV SOLN: 250.0000 mL | INTRAVENOUS | Status: DC | PRN

## 2019-12-06 MED ORDER — FENTANYL CITRATE (PF) 100 MCG/2ML IJ SOLN
INTRAMUSCULAR | Status: AC
  Filled 2019-12-06: qty 2

## 2019-12-06 MED ORDER — ASPIRIN EC 81 MG PO TBEC
81.0000 mg | DELAYED_RELEASE_TABLET | Freq: Every day | ORAL | Status: DC
  Administered 2019-12-06 – 2019-12-07 (×2): 81 mg via ORAL
  Filled 2019-12-06 (×2): qty 1

## 2019-12-06 MED ORDER — METOPROLOL TARTRATE 12.5 MG HALF TABLET
12.5000 mg | ORAL_TABLET | Freq: Two times a day (BID) | ORAL | Status: DC
  Administered 2019-12-06 – 2019-12-07 (×3): 12.5 mg via ORAL
  Filled 2019-12-06 (×3): qty 1

## 2019-12-06 MED ORDER — LISINOPRIL 5 MG PO TABS
5.0000 mg | ORAL_TABLET | Freq: Every day | ORAL | Status: DC
  Administered 2019-12-06: 12:00:00 5 mg via ORAL
  Filled 2019-12-06: qty 1

## 2019-12-06 MED ORDER — HEPARIN SODIUM (PORCINE) 1000 UNIT/ML IJ SOLN
INTRAMUSCULAR | Status: DC | PRN
  Administered 2019-12-06: 8000 [IU] via INTRAVENOUS

## 2019-12-06 MED ORDER — PANTOPRAZOLE SODIUM 40 MG PO TBEC
40.0000 mg | DELAYED_RELEASE_TABLET | Freq: Every day | ORAL | Status: DC
  Administered 2019-12-06 – 2019-12-07 (×2): 40 mg via ORAL
  Filled 2019-12-06 (×2): qty 1

## 2019-12-06 MED ORDER — LIDOCAINE HCL (PF) 1 % IJ SOLN
INTRAMUSCULAR | Status: AC
  Filled 2019-12-06: qty 30

## 2019-12-06 MED ORDER — HYDROCODONE-ACETAMINOPHEN 5-325 MG PO TABS
1.0000 | ORAL_TABLET | Freq: Three times a day (TID) | ORAL | Status: DC | PRN
  Administered 2019-12-06 – 2019-12-07 (×3): 1 via ORAL
  Filled 2019-12-06 (×3): qty 1

## 2019-12-06 MED ORDER — ACETAMINOPHEN 325 MG PO TABS
650.0000 mg | ORAL_TABLET | ORAL | Status: DC | PRN
  Administered 2019-12-06 (×3): 650 mg via ORAL

## 2019-12-06 MED ORDER — TIROFIBAN HCL IN NACL 5-0.9 MG/100ML-% IV SOLN
INTRAVENOUS | Status: DC | PRN
  Administered 2019-12-06: 0.15 ug/kg/min via INTRAVENOUS

## 2019-12-06 MED ORDER — TIROFIBAN HCL IN NACL 5-0.9 MG/100ML-% IV SOLN
INTRAVENOUS | Status: AC
  Filled 2019-12-06: qty 100

## 2019-12-06 MED ORDER — SODIUM CHLORIDE 0.9 % IV SOLN
INTRAVENOUS | Status: AC | PRN
  Administered 2019-12-06: 10 mL/h via INTRAVENOUS

## 2019-12-06 MED ORDER — VERAPAMIL HCL 2.5 MG/ML IV SOLN
INTRAVENOUS | Status: AC
  Filled 2019-12-06: qty 2

## 2019-12-06 MED ORDER — FENTANYL CITRATE (PF) 100 MCG/2ML IJ SOLN
INTRAMUSCULAR | Status: DC | PRN
  Administered 2019-12-06 (×2): 25 ug via INTRAVENOUS

## 2019-12-06 MED ORDER — NITROGLYCERIN 0.4 MG SL SUBL
0.4000 mg | SUBLINGUAL_TABLET | SUBLINGUAL | Status: DC | PRN
  Administered 2019-12-06 (×2): 0.4 mg via SUBLINGUAL
  Filled 2019-12-06: qty 1

## 2019-12-06 MED ORDER — NICOTINE 21 MG/24HR TD PT24
21.0000 mg | MEDICATED_PATCH | Freq: Every day | TRANSDERMAL | Status: DC
  Administered 2019-12-06 – 2019-12-07 (×2): 21 mg via TRANSDERMAL
  Filled 2019-12-06 (×2): qty 1

## 2019-12-06 MED ORDER — TICAGRELOR 90 MG PO TABS
90.0000 mg | ORAL_TABLET | Freq: Two times a day (BID) | ORAL | Status: DC
  Administered 2019-12-06 – 2019-12-07 (×3): 90 mg via ORAL
  Filled 2019-12-06 (×3): qty 1

## 2019-12-06 MED ORDER — TIROFIBAN (AGGRASTAT) BOLUS VIA INFUSION
INTRAVENOUS | Status: DC | PRN
  Administered 2019-12-06: 01:00:00 2437.5 ug via INTRAVENOUS

## 2019-12-06 MED ORDER — ATORVASTATIN CALCIUM 80 MG PO TABS: 80.0000 mg | ORAL_TABLET | Freq: Every day | ORAL | Status: DC

## 2019-12-06 MED ORDER — IOHEXOL 350 MG/ML SOLN
INTRAVENOUS | Status: DC | PRN
  Administered 2019-12-06: 01:00:00 120 mL via INTRA_ARTERIAL

## 2019-12-06 MED ORDER — HYDRALAZINE HCL 20 MG/ML IJ SOLN: 10.0000 mg | INTRAMUSCULAR | Status: AC | PRN

## 2019-12-06 MED ORDER — LISINOPRIL 10 MG PO TABS
10.0000 mg | ORAL_TABLET | Freq: Every day | ORAL | Status: DC
  Administered 2019-12-07: 10:00:00 10 mg via ORAL
  Filled 2019-12-06: qty 1

## 2019-12-06 MED ORDER — VERAPAMIL HCL 2.5 MG/ML IV SOLN
INTRAVENOUS | Status: DC | PRN
  Administered 2019-12-06: 10 mL via INTRA_ARTERIAL

## 2019-12-06 MED ORDER — LIDOCAINE HCL (PF) 1 % IJ SOLN
INTRAMUSCULAR | Status: DC | PRN
  Administered 2019-12-06: 2 mL

## 2019-12-06 MED ORDER — NITROGLYCERIN 1 MG/10 ML FOR IR/CATH LAB
INTRA_ARTERIAL | Status: DC | PRN
  Administered 2019-12-06: 200 ug via INTRACORONARY
  Administered 2019-12-06: 200 ug via INTRA_ARTERIAL

## 2019-12-06 MED ORDER — ATORVASTATIN CALCIUM 80 MG PO TABS
80.0000 mg | ORAL_TABLET | Freq: Every day | ORAL | Status: DC
  Administered 2019-12-06 – 2019-12-07 (×2): 80 mg via ORAL
  Filled 2019-12-06 (×2): qty 1

## 2019-12-06 MED ORDER — POTASSIUM CHLORIDE CRYS ER 20 MEQ PO TBCR
40.0000 meq | EXTENDED_RELEASE_TABLET | Freq: Once | ORAL | Status: AC
  Administered 2019-12-06: 11:00:00 40 meq via ORAL
  Filled 2019-12-06: qty 2

## 2019-12-06 MED ORDER — ACETAMINOPHEN 325 MG PO TABS
650.0000 mg | ORAL_TABLET | ORAL | Status: DC | PRN
  Filled 2019-12-06 (×3): qty 2

## 2019-12-06 MED ORDER — LABETALOL HCL 5 MG/ML IV SOLN: 10.0000 mg | INTRAVENOUS | Status: AC | PRN

## 2019-12-06 MED ORDER — HEPARIN SODIUM (PORCINE) 1000 UNIT/ML IJ SOLN
INTRAMUSCULAR | Status: AC
  Filled 2019-12-06: qty 1

## 2019-12-06 MED ORDER — HEPARIN (PORCINE) IN NACL 1000-0.9 UT/500ML-% IV SOLN
INTRAVENOUS | Status: DC | PRN
  Administered 2019-12-06 (×2): 500 mL

## 2019-12-06 MED ORDER — ASPIRIN EC 81 MG PO TBEC: 81.0000 mg | DELAYED_RELEASE_TABLET | Freq: Every day | ORAL | Status: DC

## 2019-12-06 MED ORDER — IOHEXOL 350 MG/ML SOLN
INTRAVENOUS | Status: AC
  Filled 2019-12-06: qty 1

## 2019-12-06 MED ORDER — SODIUM CHLORIDE 0.9% FLUSH: 3.0000 mL | INTRAVENOUS | Status: DC | PRN

## 2019-12-06 MED ORDER — TICAGRELOR 90 MG PO TABS
ORAL_TABLET | ORAL | Status: AC
  Filled 2019-12-06: qty 2

## 2019-12-06 MED ORDER — SODIUM CHLORIDE 0.9% FLUSH
3.0000 mL | Freq: Two times a day (BID) | INTRAVENOUS | Status: DC
  Administered 2019-12-06: 22:00:00 3 mL via INTRAVENOUS

## 2019-12-06 MED ORDER — MIDAZOLAM HCL 2 MG/2ML IJ SOLN
INTRAMUSCULAR | Status: AC
  Filled 2019-12-06: qty 2

## 2019-12-06 MED ORDER — LISINOPRIL 5 MG PO TABS
5.0000 mg | ORAL_TABLET | Freq: Once | ORAL | Status: AC
  Administered 2019-12-06: 17:00:00 5 mg via ORAL
  Filled 2019-12-06: qty 1

## 2019-12-06 MED ORDER — SODIUM CHLORIDE 0.9 % IV SOLN: INTRAVENOUS | Status: AC

## 2019-12-06 MED ORDER — TICAGRELOR 90 MG PO TABS
ORAL_TABLET | ORAL | Status: DC | PRN
  Administered 2019-12-06: 180 mg via ORAL

## 2019-12-06 MED ORDER — MIDAZOLAM HCL 2 MG/2ML IJ SOLN
INTRAMUSCULAR | Status: DC | PRN
  Administered 2019-12-06 (×2): 1 mg via INTRAVENOUS

## 2019-12-06 MED ORDER — HEPARIN (PORCINE) IN NACL 1000-0.9 UT/500ML-% IV SOLN
INTRAVENOUS | Status: AC
  Filled 2019-12-06: qty 1000

## 2019-12-06 SURGICAL SUPPLY — 20 items
BALLN SAPPHIRE 2.5X15 (BALLOONS) ×2
BALLN SAPPHIRE ~~LOC~~ 3.0X18 (BALLOONS) ×2 IMPLANT
BALLOON SAPPHIRE 2.5X15 (BALLOONS) ×1 IMPLANT
CATH EXTRAC PRONTO 5.5F 138CM (CATHETERS) ×2 IMPLANT
CATH IMPULSE 5F ANG/FL3.5 (CATHETERS) ×2 IMPLANT
CATH LAUNCHER 6FR EBU3.5 (CATHETERS) ×2 IMPLANT
DEVICE RAD COMP TR BAND LRG (VASCULAR PRODUCTS) ×2 IMPLANT
ELECT DEFIB PAD ADLT CADENCE (PAD) ×2 IMPLANT
GLIDESHEATH SLEND SS 6F .021 (SHEATH) ×2 IMPLANT
GUIDEWIRE INQWIRE 1.5J.035X260 (WIRE) ×2 IMPLANT
INQWIRE 1.5J .035X260CM (WIRE) ×4
KIT ENCORE 26 ADVANTAGE (KITS) ×2 IMPLANT
KIT HEART LEFT (KITS) ×2 IMPLANT
KIT HEMO VALVE WATCHDOG (MISCELLANEOUS) ×2 IMPLANT
PACK CARDIAC CATHETERIZATION (CUSTOM PROCEDURE TRAY) ×2 IMPLANT
STENT RESOLUTE ONYX 2.5X30 (Permanent Stent) ×2 IMPLANT
TRANSDUCER W/STOPCOCK (MISCELLANEOUS) ×2 IMPLANT
TUBING CIL FLEX 10 FLL-RA (TUBING) ×2 IMPLANT
WIRE ASAHI PROWATER 180CM (WIRE) ×2 IMPLANT
WIRE HI TORQ BMW 190CM (WIRE) ×4 IMPLANT

## 2019-12-06 NOTE — Progress Notes (Signed)
*   Echocardiogram 2D Echocardiogram has been performed.  Pieter Partridge 12/06/2019, 8:51 AM

## 2019-12-06 NOTE — Plan of Care (Signed)
  Problem: Education: Goal: Understanding of cardiac disease, CV risk reduction, and recovery process will improve Outcome: Progressing Goal: Understanding of medication regimen will improve Outcome: Progressing Goal: Individualized Educational Video(s) Outcome: Progressing   Problem: Cardiac: Goal: Ability to achieve and maintain adequate cardiopulmonary perfusion will improve Outcome: Progressing Goal: Vascular access site(s) Level 0-1 will be maintained Outcome: Progressing
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
## 2019-12-06 NOTE — Progress Notes (Signed)
CARDIAC REHAB PHASE I   PRE:  Rate/Rhythm: 72 SR  BP:  Supine:   Sitting: 139/88  Standing:    SaO2: 99%RA  MODE:  Ambulation: 510 ft   POST:  Rate/Rhythm: 78 SR  BP:  Supine:   Sitting: 130/98  Standing:    SaO2: 99%RA 1320-1425 Pt walked 510 ft on RA with steady gait and no increase in chest discomfort. To recliner after walk. MI education completed with pt who voiced understanding. Stressed importance of brilinta with stent. Pt stated he is not real good at taking meds. Discussed importance and that he needed to set alarm on phone or get pill box as he must take twice a day or he can have another MI. Discussed NTG use, MI restrictions, gave heart healthy diet, smoking cessation handout and CRP 2. Gave pt smoking cessation handout and encouraged him to call 1800quitnow. Pt has nicotine patch now; pt stated will be really hard for him to quit.. Discussed CRP 2 and referred to [place].  Pt is interested in participating in Virtual Cardiac and Pulmonary Rehab. Pt advised that Virtual Cardiac and Pulmonary Rehab is provided at no cost to the patient.  Checklist:  1. Pt has smart device  ie smartphone and/or ipad for downloading an app  Yes 2. Reliable internet/wifi service    Yes 3. Understands how to use their smartphone and navigate within an app.  Yes   Pt verbalized understanding and is in agreement.    Luetta Nutting, RN BSN  12/06/2019 2:19 PM

## 2019-12-06 NOTE — Progress Notes (Addendum)
Progress Note  Patient Name: Matthew Mccarty Date of Encounter: 12/06/2019  Primary Cardiologist: No primary care provider on file. Matthew Mccarty  Subjective   Mild chest discomfort-received nitroglycerin without relief.  He describes it at about a 3 out of 10.  Nothing like what he had prior to his PCI.  Inpatient Medications    Scheduled Meds: . aspirin EC  81 mg Oral Daily  . atorvastatin  80 mg Oral Daily  . lisinopril  5 mg Oral Daily  . metoprolol tartrate  12.5 mg Oral BID  . nicotine  21 mg Transdermal Daily  . pantoprazole  40 mg Oral Daily  . potassium chloride  40 mEq Oral Once  . sodium chloride flush  3 mL Intravenous Q12H  . ticagrelor  90 mg Oral BID   Continuous Infusions: . sodium chloride     PRN Meds: sodium chloride, acetaminophen, acetaminophen, nitroGLYCERIN, ondansetron (ZOFRAN) IV, ondansetron (ZOFRAN) IV, sodium chloride flush   Vital Signs    Vitals:   12/06/19 0330 12/06/19 0629 12/06/19 0645 12/06/19 0700  BP: 126/89  124/87 118/78  Pulse: (!) 57 70 60 62  Resp: 14 13 14  (!) 9  Temp:    97.7 F (36.5 C)  TempSrc:    Oral  SpO2: 100% 100% 99% 100%  Weight:      Height:        Intake/Output Summary (Last 24 hours) at 12/06/2019 1108 Last data filed at 12/06/2019 0649 Gross per 24 hour  Intake 684.67 ml  Output 1020 ml  Net -335.33 ml   Last 3 Weights 12/06/2019 12/05/2019 11/04/2019  Weight (lbs) 205 lb 0.4 oz 215 lb 221 lb 6.4 oz  Weight (kg) 93 kg 97.523 kg 100.426 kg      Telemetry    Normal sinus rhythm- Personally Reviewed  ECG    Normal sinus rhythm, nonspecific ST changes- Personally Reviewed  Physical Exam   GEN: No acute distress.   Neck: No JVD Cardiac: RRR, no murmurs, rubs, or gallops.  Respiratory: Clear to auscultation bilaterally. GI: Soft, nontender, non-distended  MS: No edema; No deformity.  2+ right radial pulse, no hematoma Neuro:  Nonfocal  Psych: Normal affect   Labs    High Sensitivity Troponin:   Recent  Labs  Lab 12/05/19 2133 12/05/19 2322 12/06/19 0559  TROPONINIHS 233* 141* >27,000*      Chemistry Recent Labs  Lab 12/05/19 2133 12/06/19 0230  NA 143 139  K 3.9 3.7  CL 102 102  CO2 32 29  GLUCOSE 112* 132*  BUN 13 9  CREATININE 1.07 1.01  CALCIUM 9.3 9.1  GFRNONAA >60 >60  GFRAA >60 >60  ANIONGAP 9 8     Hematology Recent Labs  Lab 12/05/19 2133 12/06/19 0230  WBC 5.7 7.7  RBC 5.01 4.96  HGB 13.7 13.3  HCT 41.3 40.7  MCV 82.4 82.1  MCH 27.3 26.8  MCHC 33.2 32.7  RDW 15.8* 15.7*  PLT 310 303    BNP Recent Labs  Lab 12/05/19 2319  BNP 21.9     DDimer  Recent Labs  Lab 12/05/19 2322  DDIMER 0.54*     Radiology    DG Chest 2 View  Result Date: 12/05/2019 CLINICAL DATA:  Upper chest pain, shortness of breath EXAM: CHEST - 2 VIEW COMPARISON:  None. FINDINGS: Heart and mediastinal contours are within normal limits. No focal opacities or effusions. No acute bony abnormality. IMPRESSION: No active cardiopulmonary disease. Electronically Signed   By: 02/04/2020  [place] M.D.   On: 12/05/2019 21:44   CARDIAC CATHETERIZATION  Result Date: 12/06/2019  2nd Mrg lesion is 75% stenosed.  Balloon angioplasty was performed using a BALLOON SAPPHIRE 2.5X15.  Post intervention, there is a 40% residual stenosis. Even after a stent was placed across the ostium, there was TIMI-3 flow at the end of the procedure.  Prox Cx lesion is 100% stenosed. This was the culprit lesion.  After aspiration thrombectomy, A drug-eluting stent was successfully placed using a STENT RESOLUTE ONYX 2.5X30. This was postdilated with a 3.0 [place] balloon.  Post intervention, there is a 0% residual stenosis.  The left ventricular systolic function is normal, with mild anterolateral hypokinesis.  LV end diastolic pressure is mildly elevated. LVEDP 17 mmHg.  The left ventricular ejection fraction is 50-55% by visual estimate.  There is no aortic valve stenosis.  We will watch in ICU.  He will need  aggressive secondary prevention including smoking cessation, blood pressure management, regular exercise and healthy diet.  We will hold his amlodipine and start ACE inhibitor.  Start high-dose statin.  Start beta-blocker as well.    Cardiac Studies   Cath results reviewed, labs reviewed  Patient Profile     54 y.o. male with tobacco abuse who had a lateral wall MI  Assessment & Plan    Lateral wall MI: Continue Brilinta.  We talked about his aspirin allergy in more detail.  He had been on several medications and he stopped all of them at the same time including aspirin several years ago.  He had some itching which resolved.  He is not sure which medication it was.  He has received high-dose aspirin yesterday without any problems.  We will therefore continue baby aspirin along with Brilinta and monitor him in the hospital.  If there is any sign of allergy, would use Brilinta monotherapy.  Hyperlipidemia: LDL well above target.  Atorvastatin started.  Whole food, plant-based diet will be recommended.  Prediabetes: A1c 6.2.  ACE inhibitor added for hypertension.  Uptitrate as needed.  Amlodipine stopped.  Tobacco abuse: We will use nicotine patch to help him stop smoking.  Okay to move out of unit later today.  Plan for discharge tomorrow if no new issues arise.  In terms of his chest discomfort: I think this is likely routine post stenting pain.  ECG does not show any significant abnormalities.  He did have a branch that was jailed but there was TIMI-3 flow.    For questions or updates, please contact [place] Please consult [email or web address] for contact info under        Signed, Larae Grooms, MD  12/06/2019, 11:08 AM

## 2019-12-07 ENCOUNTER — Telehealth: Payer: Self-pay | Admitting: Interventional Cardiology

## 2019-12-07 DIAGNOSIS — E785 Hyperlipidemia, unspecified: Secondary | ICD-10-CM

## 2019-12-07 DIAGNOSIS — I255 Ischemic cardiomyopathy: Secondary | ICD-10-CM

## 2019-12-07 DIAGNOSIS — R7303 Prediabetes: Secondary | ICD-10-CM

## 2019-12-07 DIAGNOSIS — I2510 Atherosclerotic heart disease of native coronary artery without angina pectoris: Secondary | ICD-10-CM

## 2019-12-07 DIAGNOSIS — Z720 Tobacco use: Secondary | ICD-10-CM

## 2019-12-07 DIAGNOSIS — E782 Mixed hyperlipidemia: Secondary | ICD-10-CM

## 2019-12-07 LAB — BASIC METABOLIC PANEL
Anion gap: 6 (ref 5–15)
BUN: 11 mg/dL (ref 6–20)
CO2: 26 mmol/L (ref 22–32)
Calcium: 9.2 mg/dL (ref 8.9–10.3)
Chloride: 106 mmol/L (ref 98–111)
Creatinine, Ser: 1.05 mg/dL (ref 0.61–1.24)
GFR calc Af Amer: >60 mL/min (ref >60)
GFR calc non Af Amer: >60 mL/min (ref >60)
Glucose, Bld: 107 mg/dL — ABNORMAL HIGH (ref 70–99)
Potassium: 4 mmol/L (ref 3.5–5.1)
Sodium: 138 mmol/L (ref 135–145)

## 2019-12-07 LAB — CBC
HCT: 39.3 % (ref 39.0–52.0)
Hemoglobin: 12.8 g/dL — ABNORMAL LOW (ref 13.0–17.0)
MCH: 26.8 pg (ref 26.0–34.0)
MCHC: 32.6 g/dL (ref 30.0–36.0)
MCV: 82.4 fL (ref 80.0–100.0)
Platelets: 281 10*3/uL (ref 150–400)
RBC: 4.77 MIL/uL (ref 4.22–5.81)
RDW: 15.9 % — ABNORMAL HIGH (ref 11.5–15.5)
WBC: 8.5 10*3/uL (ref 4.0–10.5)
nRBC: 0 % (ref 0.0–0.2)

## 2019-12-07 MED ORDER — NITROGLYCERIN 0.4 MG SL SUBL: 0.4000 mg | SUBLINGUAL_TABLET | SUBLINGUAL | 3 refills | Status: AC | PRN

## 2019-12-07 MED ORDER — CYCLOBENZAPRINE HCL 10 MG PO TABS
10.0000 mg | ORAL_TABLET | Freq: Two times a day (BID) | ORAL | Status: DC | PRN
  Filled 2019-12-07: qty 1

## 2019-12-07 MED ORDER — ASPIRIN 81 MG PO TBEC: 81.0000 mg | DELAYED_RELEASE_TABLET | Freq: Every day | ORAL | 3 refills | Status: AC

## 2019-12-07 MED ORDER — TICAGRELOR 90 MG PO TABS: 90.0000 mg | ORAL_TABLET | Freq: Two times a day (BID) | ORAL | 0 refills | Status: AC

## 2019-12-07 MED ORDER — LISINOPRIL 10 MG PO TABS: 10.0000 mg | ORAL_TABLET | Freq: Every day | ORAL | 3 refills | Status: DC

## 2019-12-07 MED ORDER — NICOTINE 21 MG/24HR TD PT24: 21.0000 mg | MEDICATED_PATCH | Freq: Every day | TRANSDERMAL | 0 refills | Status: AC

## 2019-12-07 MED ORDER — PANTOPRAZOLE SODIUM 40 MG PO TBEC: 40.0000 mg | DELAYED_RELEASE_TABLET | Freq: Every day | ORAL | 3 refills | Status: AC

## 2019-12-07 MED ORDER — TRAMADOL HCL 50 MG PO TABS: 50.0000 mg | ORAL_TABLET | Freq: Four times a day (QID) | ORAL | Status: DC | PRN

## 2019-12-07 MED ORDER — ATORVASTATIN CALCIUM 80 MG PO TABS: 80.0000 mg | ORAL_TABLET | Freq: Every day | ORAL | 3 refills | Status: AC

## 2019-12-07 MED ORDER — METOPROLOL TARTRATE 25 MG PO TABS: 12.5000 mg | ORAL_TABLET | Freq: Two times a day (BID) | ORAL | 3 refills | Status: AC

## 2019-12-07 MED FILL — METOPROLOL TARTRATE 25 MG T: 25 | 30 days supply | Qty: 30 | Fill #0

## 2019-12-07 MED FILL — ASPIRIN LOW DOSE 81 MG TBEC: 81 | 90 days supply | Qty: 90 | Fill #0

## 2019-12-07 MED FILL — ATORVASTATIN CALCIUM 80 MG: 80 | 30 days supply | Qty: 30 | Fill #0

## 2019-12-07 MED FILL — NITROGLYCERIN 0.4 MG TAB SL: 0.4 | 7 days supply | Qty: 25 | Fill #0

## 2019-12-07 MED FILL — BRILINTA 90 MG TABLET: 90 | 30 days supply | Qty: 60 | Fill #0

## 2019-12-07 MED FILL — PANTOPRAZOLE SOD DR 40 MG T: 40 | 30 days supply | Qty: 30 | Fill #0

## 2019-12-07 MED FILL — LISINOPRIL 10 MG TABS: 10 | 30 days supply | Qty: 30 | Fill #0

## 2019-12-07 NOTE — Telephone Encounter (Signed)
New Message    Pt has TOC appt 12/14/19 at 2:15pm

## 2019-12-07 NOTE — Plan of Care (Signed)
Progressing well, ambulated 2 laps of unit without chest pain/pressure/SOB. SR/SB with occasional PVCs. Reinforcing heart healthy lifestyle changes including tobacco cessation, importance of taking meds as prescribed. Has transfer orders, awaiting bed assignment.    Problem: Education: Goal: Understanding of cardiac disease, CV risk reduction, and recovery process will improve Outcome: Progressing Goal: Understanding of medication regimen will improve Outcome: Progressing   Problem: Activity: Goal: Ability to tolerate increased activity will improve Outcome: Progressing   Problem: Cardiac: Goal: Ability to achieve and maintain adequate cardiopulmonary perfusion will improve Outcome: Progressing Goal: Vascular access site(s) Level 0-1 will be maintained Outcome: Progressing   Problem: Health Behavior/Discharge Planning: Goal: Ability to safely manage health-related needs after discharge will improve Outcome: Progressing

## 2019-12-07 NOTE — Care Management (Signed)
For patients without insurance, Matthew Mccarty is $475-500 per month out of pocket. Good Rx coupon will bring price down to $375. There is a one time use manufacturer's coupon for a 30 day supply FREE of charge.  Patient will need to establish with PCP such as [facility] who can assist with cost of medication through their pharmacy, or be provided with samples from office to continue after 30 day supply runs out. Both options would require patient to be compliant and able to follow through with follow up appointments.   Patient could also apply for manufacturer's patient assistance through PCP, confirmation of approval into this would not be established until after discharge.

## 2019-12-07 NOTE — Discharge Summary (Addendum)
Discharge Summary    Patient ID: Matthew Mccarty MRN: [id number]; DOB: 1965/09/24  Admit date: 12/05/2019 Discharge date: 12/07/2019  Primary Care Provider: Patient, No Pcp Per  Primary Cardiologist: Dr. Eldridge Dace Primary Electrophysiologist:  None   Discharge Diagnoses    Principal Problem:   STEMI (ST elevation myocardial infarction) [facility]) Active Problems:   Hypertension   Acute MI, lateral wall (HCC)   Hyperlipidemia   Pre-diabetes   Tobacco abuse   Coronary artery disease   Ischemic cardiomyopathy    Diagnostic Studies/Procedures    Left heart catheterization 12/06/19:  2nd Mrg lesion is 75% stenosed.  Balloon angioplasty was performed using a BALLOON SAPPHIRE 2.5X15.  Post intervention, there is a 40% residual stenosis. Even after a stent was placed across the ostium, there was TIMI-3 flow at the end of the procedure.  Prox Cx lesion is 100% stenosed. This was the culprit lesion.  After aspiration thrombectomy, A drug-eluting stent was successfully placed using a STENT RESOLUTE ONYX 2.5X30. This was postdilated with a 3.0 [place] balloon.  Post intervention, there is a 0% residual stenosis.  The left ventricular systolic function is normal, with mild anterolateral hypokinesis.  LV end diastolic pressure is mildly elevated. LVEDP 17 mmHg.  The left ventricular ejection fraction is 50-55% by visual estimate.  There is no aortic valve stenosis.   We will watch in ICU.  He will need aggressive secondary prevention including smoking cessation, blood pressure management, regular exercise and healthy diet.  We will hold his amlodipine and start ACE inhibitor.  Start high-dose statin.  Start beta-blocker as well.  Echocardiogram 12/06/19:  _____________   History of Present Illness     Matthew Mccarty is a 54 y.o. male with HTN and tobacco abuse, who presented to [facility] with complaints of chest soreness since the afternoon of 12/05/19 (starting at 2 PM).  Patient stated  that he had a pressure like sensation in his chest along with soreness that was pretty constant over time.  He denied any associated diaphoresis, nausea or vomiting.  He did feel that it was difficult to catch his breath due to the chest tightness.  There was no radiation of the pain.  While waiting in the emergency department his symptoms got appreciably worse.  Though the initial baseline ECG did not reveal any ischemic changes his subsequent tracing showed ST elevations in the lateral leads.  The patient is on disability and is not very active.  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  He does admit to current tobacco use.  He states compliance with his blood pressure medication.   In the hospital his blood pressure was 153/95 with a heart rate of 66 bpm.  His labs were as follows: Potassium 3.9, creatinine 1.07, calcium 9.3, B-type natriuretic peptide 21.9, WBC 5.7, hemoglobin 13.7, hematocrit 41.3 and platelets 310.  The high-sensitivity troponins were 233 and 141.  The chest x-ray was unremarkable.  In the ED he received chewable aspirin, and heparin bolus 4000 units.  He was transferred to [facility] for consideration for an emergent cardiac catheterization procedure.  Hospital Course     Consultants: None   1. STEMI: patient presented with chest pain. Found to have STE in lateral leads on EKG. Emergently transferred from [facility] to [facility] for cardiac catheterization. Found to have a 100% pLCx stenosis managed with PCI/DES, as well as 75% 2nd marginal stenosis managed with balloon angioplasty with residual 40% stenosis following PCI. He was started on  aspirin and brilinta for DAPT, with plans for uninterrupted therapy x1 year. Aggressive risk factor modifications recommended as below.  - Continue aspirin and brilinta. Cost is an issue with brilinta. He was given a free 30 day prescription with plans to transition to plavix outpatient after completion of 1 month of brilinta therapy. Rx for  plavix with loading dose will need to be submitted at follow-up visit.  - Continue statin - Continue metoprolol.   2. Ischemic cardiomyopathy: EF 45-50% with indeterminate LV diastolic function on echo this admission. Started on metoprolol and lisinopril. Hopeful he will recover LV function now that he is s/p PCI.  - Continue metoprolol and lisinopril - Anticipate repeat echo in a few months for close monitoring.   3. HTN: BP intermittently elevated this admission. Home amlodipine discontinued. Started on metoprolol tartrate 12.5mg  BID and lisinopril  daily - Continue metoprolol and lisinopril  4. HLD: LDL 164 this admission. Started on atorvastatin  daily - Continue atorvastatin - Will need repeat FLP/LFTs in 6-8 weeks for close monitoring  5. Pre-DM type 2: A1C 6.2 this admission.  - Encouraged healthy dietary/lifestyle modifications to prevent progression   6. Tobacco abuse: reports being an every day smoker. Educated on health risks of smoking this admission. - Smoking cessation encouraged.   Spoke with the patient to determine plan for cardiology follow-up in [place]. He did not have a provider in mind. Reports he will be staying in [place] for the next month. Outpatient follow-up with [facility] arranged for close monitoring. Patient instructed to have information regarding with whom and when he will follow-up upon return to [facility]. He was in agreement with this plan.   Did the patient have an acute coronary syndrome (MI, NSTEMI, STEMI, etc) this admission?:  Yes                               AHA/ACC Clinical Performance & Quality Measures: 1. Aspirin prescribed? - Yes 2. ADP Receptor Inhibitor (Plavix/Clopidogrel, Brilinta/Ticagrelor or Effient/Prasugrel) prescribed (includes medically managed patients)? - Yes 3. Beta Blocker prescribed? - Yes 4. High Intensity Statin (Lipitor 40-80mg  or Crestor 20-40mg ) prescribed? - Yes 5. EF assessed during THIS hospitalization? - Yes 6. For  EF <40%, was ACEI/ARB prescribed? - Yes 7. For EF <40%, Aldosterone Antagonist (Spironolactone or Eplerenone) prescribed? - Not Applicable (EF >/= 40%) 8. Cardiac Rehab Phase II ordered (Included Medically managed Patients)? - Yes   _____________  Discharge Vitals Blood pressure (!) 121/94, pulse (!) 57, temperature 98.5 F (36.9 C), temperature source Oral, resp. rate 16, height  (1.778 m), weight 93.8 kg, SpO2 99 %.  Filed Weights   12/05/19 2121 12/06/19 0135 12/07/19 0400  Weight: 97.5 kg 93 kg 93.8 kg    Labs & Radiologic Studies    CBC Recent Labs    12/06/19 0230 12/07/19 0351  WBC 7.7 8.5  HGB 13.3 12.8*  HCT 40.7 39.3  MCV 82.1 82.4  PLT 303 281   Basic Metabolic Panel Recent Labs    16/10/96 0230 12/07/19 0930  NA 139 138  K 3.7 4.0  CL 102 106  CO2 29 26  GLUCOSE 132* 107*  BUN 9 11  CREATININE 1.01 1.05  CALCIUM 9.1 9.2  MG 2.0  --    Liver Function Tests No results for input(s): AST, ALT, ALKPHOS, BILITOT, PROT, ALBUMIN in the last 72 hours. No results for input(s): LIPASE, AMYLASE in the last 72  hours. High Sensitivity Troponin:   Recent Labs  Lab 12/05/19 2133 12/05/19 2322 12/06/19 0559  TROPONINIHS 233* 141* >27,000*    BNP Invalid input(s): POCBNP D-Dimer Recent Labs    12/05/19 2322  DDIMER 0.54*   Hemoglobin A1C Recent Labs    12/06/19 0230  HGBA1C 6.2*   Fasting Lipid Panel Recent Labs    12/06/19 0230  CHOL 214*  HDL 45  LDLCALC 164*  TRIG 27  CHOLHDL 4.8   Thyroid Function Tests No results for input(s): TSH, T4TOTAL, T3FREE, THYROIDAB in the last 72 hours.  Invalid input(s): FREET3 _____________  DG Chest 2 View  Result Date: 12/05/2019 CLINICAL DATA:  Upper chest pain, shortness of breath EXAM: CHEST - 2 VIEW COMPARISON:  None. FINDINGS: Heart and mediastinal contours are within normal limits. No focal opacities or effusions. No acute bony abnormality. IMPRESSION: No active cardiopulmonary disease.  Electronically Signed   By: Charlett NoseKevin  Dover M.D.   On: 12/05/2019 21:44   CARDIAC CATHETERIZATION  Result Date: 12/06/2019  2nd Mrg lesion is 75% stenosed.  Balloon angioplasty was performed using a BALLOON SAPPHIRE 2.5X15.  Post intervention, there is a 40% residual stenosis. Even after a stent was placed across the ostium, there was TIMI-3 flow at the end of the procedure.  Prox Cx lesion is 100% stenosed. This was the culprit lesion.  After aspiration thrombectomy, A drug-eluting stent was successfully placed using a STENT RESOLUTE ONYX 2.5X30. This was postdilated with a 3.0 [place] balloon.  Post intervention, there is a 0% residual stenosis.  The left ventricular systolic function is normal, with mild anterolateral hypokinesis.  LV end diastolic pressure is mildly elevated. LVEDP 17 mmHg.  The left ventricular ejection fraction is 50-55% by visual estimate.  There is no aortic valve stenosis.  We will watch in ICU.  He will need aggressive secondary prevention including smoking cessation, blood pressure management, regular exercise and healthy diet.  We will hold his amlodipine and start ACE inhibitor.  Start high-dose statin.  Start beta-blocker as well.   ECHOCARDIOGRAM COMPLETE  Result Date: 12/06/2019    ECHOCARDIOGRAM REPORT   Patient Name:   Matthew Mccarty Date of Exam: 12/06/2019 Medical Rec #:  [id number]   Height:       70.0 in Accession #:    [id number]  Weight:       205.0 lb Date of Birth:  1966/02/04   BSA:          2.109 m Patient Age:    53 years    BP:           118/78 mmHg Patient Gender: M           HR:           62 bpm. Exam Location:  Inpatient Procedure: 2D Echo and Color Doppler Indications:    121-121.4 ST elevation (STEMI) and non-ST elevation (NSTEMI)                 nyocardial infarction  History:        Patient has no prior history of Echocardiogram examinations.                 Acute MI, Signs/Symptoms:Chest Pain; Risk Factors:Current Smoker                 and Dyslipidemia.   Sonographer:    Lavenia AtlasBrooke Strickland Referring Phys: [id number] Lonie PeakMOHAMMED W AKHTER IMPRESSIONS  1. Left ventricular ejection fraction, by estimation, is 45 to 50%. The  left ventricle has mildly decreased function. The left ventricle demonstrates regional wall motion abnormalities (see scoring diagram/findings for description). Left ventricular diastolic parameters are indeterminate. There is mild hypokinesis of the left ventricular, entire inferolateral wall.  2. Right ventricular systolic function is normal. The right ventricular size is normal.  3. The mitral valve is normal in structure. Trivial mitral valve regurgitation. No evidence of mitral stenosis.  4. The aortic valve is tricuspid. Aortic valve regurgitation is not visualized. No aortic stenosis is present.  5. The inferior vena cava is normal in size with greater than 50% respiratory variability, suggesting right atrial pressure of 3 mmHg. Comparison(s): No prior Echocardiogram. Conclusion(s)/Recommendation(s): Hypokinesis of inferolateral wall with mildly reduced EF. FINDINGS  Left Ventricle: Left ventricular ejection fraction, by estimation, is 45 to 50%. The left ventricle has mildly decreased function. The left ventricle demonstrates regional wall motion abnormalities. Mild hypokinesis of the left ventricular, entire inferolateral wall. The left ventricular internal cavity size was normal in size. There is borderline concentric left ventricular hypertrophy. Left ventricular diastolic parameters are indeterminate. Right Ventricle: The right ventricular size is normal. No increase in right ventricular wall thickness. Right ventricular systolic function is normal. Left Atrium: Left atrial size was normal in size. Right Atrium: Right atrial size was normal in size. Pericardium: Trivial pericardial effusion is present. Presence of pericardial fat pad. Mitral Valve: The mitral valve is normal in structure. Trivial mitral valve regurgitation. No evidence of mitral  valve stenosis. Tricuspid Valve: The tricuspid valve is normal in structure. Tricuspid valve regurgitation is trivial. No evidence of tricuspid stenosis. Aortic Valve: The aortic valve is tricuspid. Aortic valve regurgitation is not visualized. No aortic stenosis is present. Pulmonic Valve: The pulmonic valve was grossly normal. Pulmonic valve regurgitation is not visualized. No evidence of pulmonic stenosis. Aorta: The aortic root, ascending aorta and aortic arch are all structurally normal, with no evidence of dilitation or obstruction. Venous: The inferior vena cava is normal in size with greater than 50% respiratory variability, suggesting right atrial pressure of 3 mmHg. IAS/Shunts: No atrial level shunt detected by color flow Doppler.  LEFT VENTRICLE PLAX 2D LVIDd:         5.00 cm  Diastology LVIDs:         3.90 cm  LV e' lateral:   9.03 cm/s LV PW:         1.20 cm  LV E/e' lateral: 7.4 LV IVS:        1.30 cm  LV e' medial:    8.05 cm/s LVOT diam:     2.30 cm  LV E/e' medial:  8.2 LV SV:         80 LV SV Index:   38 LVOT Area:     4.15 cm  RIGHT VENTRICLE RV Basal diam:  2.50 cm RV S prime:     9.32 cm/s TAPSE (M-mode): 2.8 cm LEFT ATRIUM             Index       RIGHT ATRIUM           Index LA diam:        3.50 cm 1.66 cm/m  RA Area:     12.00 cm LA Vol (A2C):   54.3 ml 25.74 ml/m RA Volume:   27.80 ml  13.18 ml/m LA Vol (A4C):   49.6 ml 23.51 ml/m LA Biplane Vol: 53.6 ml 25.41 ml/m  AORTIC VALVE LVOT Vmax:   97.30 cm/s LVOT Vmean:  56.300 cm/s  LVOT VTI:    0.192 m  AORTA Ao Root diam: 3.30 cm MITRAL VALVE MV Area (PHT): 2.50 cm    SHUNTS MV Decel Time: 303 msec    Systemic VTI:  0.19 m MV E velocity: 66.40 cm/s  Systemic Diam: 2.30 cm MV A velocity: 73.70 cm/s MV E/A ratio:  0.90 Jodelle Red MD Electronically signed by Jodelle Red MD Signature Date/Time: 12/06/2019/1:09:43 PM    Final    Disposition   Pt is being discharged home today in good condition.  Follow-up Plans &  Appointments    Follow-up Information    Beatrice Lecher, PA-C Follow up on 12/14/2019.   Specialties: Cardiology, Physician Assistant Why: Please arrive 15 mintues early for your 2:15pm post-hospital cardiology follow-up appointment Contact information: [address] [place] [address] [phone]          Discharge Instructions    Amb Referral to Cardiac Rehabilitation   Complete by: As directed    Diagnosis:  Coronary Stents STEMI     After initial evaluation and assessments completed: Virtual Based Care may be provided alone or in conjunction with Phase 2 Cardiac Rehab based on patient barriers.: Yes      Discharge Medications   Allergies as of 12/07/2019      Reactions   Aspirin    Hives       Medication List    STOP taking these medications   amLODipine 10 MG tablet Commonly known as: NORVASC   omeprazole 20 MG capsule Commonly known as: PRILOSEC   oxyCODONE 15 MG immediate release tablet Commonly known as: ROXICODONE     TAKE these medications   aspirin 81 MG EC tablet Take 1 tablet (81 mg total) by mouth daily. Start taking on: Dec 08, 2019   atorvastatin 80 MG tablet Commonly known as: LIPITOR Take 1 tablet (80 mg total) by mouth daily. Start taking on: Dec 08, 2019   cyclobenzaprine 10 MG tablet Commonly known as: FLEXERIL Take 1 tablet (10 mg total) by mouth 2 (two) times daily as needed for muscle spasms.   HYDROcodone-acetaminophen 5-325 MG tablet Commonly known as: NORCO/VICODIN Take 1 tablet by mouth every 6 (six) hours as needed for moderate pain.   lisinopril 10 MG tablet Commonly known as: ZESTRIL Take 1 tablet (10 mg total) by mouth daily. Start taking on: Dec 08, 2019   metoprolol tartrate 25 MG tablet Commonly known as: LOPRESSOR Take 0.5 tablets (12.5 mg total) by mouth 2 (two) times daily.   nicotine 21 mg/24hr patch Commonly known as: NICODERM CQ - dosed in mg/24 hours Place 1 patch (21 mg total) onto  the skin daily. Start taking on: Dec 08, 2019   nitroGLYCERIN 0.4 MG SL tablet Commonly known as: NITROSTAT Place 1 tablet (0.4 mg total) under the tongue every 5 (five) minutes x 3 doses as needed for chest pain.   pantoprazole 40 MG tablet Commonly known as: PROTONIX Take 1 tablet (40 mg total) by mouth daily. Start taking on: Dec 08, 2019   ticagrelor 90 MG Tabs tablet Commonly known as: BRILINTA Take 1 tablet (90 mg total) by mouth 2 (two) times daily.          Outstanding Labs/Studies   Repeat FLP/LFTs in 6-8 weeks  Repeat echo in 3 months  Duration of Discharge Encounter   Greater than 30 minutes including physician time.  Signed, Beatriz Stallion, PA-C 12/07/2019, 12:11 PM   I have examined the patient and  reviewed assessment and plan and discussed with patient.  Agree with above as stated.    CAD/MI: He needs dual antiplatelet therapy.  Saw the note from the case manager.  What we may have to do is get him 30 days of Brilinta for free.  Then after 30 days he will take clopidogrel 75 mg daily.  He will need records that he can bring to [place].  I stressed the importance of compliance with his antiplatelet therapy to prevent stent thrombosis.  Hyperlipidemia: Continue high-dose statin.  Hypertension: Continue lisinopril.  Bmet this morning shows Cr and potassium well controled. BP  Well-controlled.  ACE inhibitor is a better choice than his amlodipine due to recent MI.  Tobacco abuse: Continue nicotine patches.  Plan for discharge later today.  He will f/u in [place].  He was given the [place] patient assistance forms.  If he is approve, could potentially consider Brilinta, but with the above plan, we have clopidogrel as another option.   Larae Grooms

## 2019-12-07 NOTE — Progress Notes (Signed)
CARDIAC REHAB PHASE I   PRE:  Rate/Rhythm: 60 SR  BP:  Supine: 125/77  Sitting:   Standing:    SaO2: 98%RA  MODE:  Ambulation: 740 ft   POST:  Rate/Rhythm: 75 SR  BP:  Supine:   Sitting: 121/94  Standing:    SaO2: 100%RA 0906-0930 Pt walked 740 ft on RA with steady gait and no CP. Tolerated well. To recliner after walk. Reinforced importance of brilinta and smoking cessation . Has educational materials for reference.   Luetta Nutting, RN BSN  12/07/2019 9:24 AM

## 2019-12-07 NOTE — Progress Notes (Signed)
Progress Note  Patient Name: Matthew Mccarty Date of Encounter: 12/07/2019  Primary Cardiologist: No primary care provider on file. Matthew Mccarty  Subjective   No further chest pain.  He reports back pain.  He states that he is on chronic pain medications, that he takes every 4 hours.  Additional history that we got today is that he is visiting his daughter.  He actually lives in [place] and he has no doctors here.  Given all that is happened, he is planning on going back to [place] after Mother's Day.  He reports some headaches for which Tylenol has not been helpful.  Inpatient Medications    Scheduled Meds: . aspirin EC  81 mg Oral Daily  . atorvastatin  80 mg Oral Daily  . Chlorhexidine Gluconate Cloth  6 each Topical Daily  . lisinopril  10 mg Oral Daily  . metoprolol tartrate  12.5 mg Oral BID  . nicotine  21 mg Transdermal Daily  . pantoprazole  40 mg Oral Daily  . sodium chloride flush  3 mL Intravenous Q12H  . ticagrelor  90 mg Oral BID   Continuous Infusions: . sodium chloride     PRN Meds: sodium chloride, acetaminophen, acetaminophen, HYDROcodone-acetaminophen, nitroGLYCERIN, ondansetron (ZOFRAN) IV, ondansetron (ZOFRAN) IV, sodium chloride flush, traMADol   Vital Signs    Vitals:   12/07/19 0600 12/07/19 0700 12/07/19 0736 12/07/19 0800  BP: 122/72 113/82  (!) 109/97  Pulse: (!) 56 66  62  Resp: 17 18  12   Temp:   98.3 F (36.8 C)   TempSrc:   Oral   SpO2: 99% 100%  100%  Weight:      Height:        Intake/Output Summary (Last 24 hours) at 12/07/2019 0918 Last data filed at 12/07/2019 0800 Gross per 24 hour  Intake 1320 ml  Output 1550 ml  Net -230 ml   Last 3 Weights 12/07/2019 12/06/2019 12/05/2019  Weight (lbs) 206 lb 12.7 oz 205 lb 0.4 oz 215 lb  Weight (kg) 93.8 kg 93 kg 97.523 kg      Telemetry    Normal sinus rhythm, occasional PVCs- Personally Reviewed  ECG    Sinus bradycardia, no ST segment changes- Personally Reviewed  Physical Exam   GEN: No  acute distress.   Neck: No JVD Cardiac: RRR, no murmurs, rubs, or gallops.  Respiratory: Clear to auscultation bilaterally. GI: Soft, nontender, non-distended  MS: No edema; No deformity.;  Small hematoma at the right biceps area where there is an IV infiltration apparently; no right radial hematoma Neuro:  Nonfocal  Psych: Normal affect   Labs    High Sensitivity Troponin:   Recent Labs  Lab 12/05/19 2133 12/05/19 2322 12/06/19 0559  TROPONINIHS 233* 141* >27,000*      Chemistry Recent Labs  Lab 12/05/19 2133 12/06/19 0230  NA 143 139  K 3.9 3.7  CL 102 102  CO2 32 29  GLUCOSE 112* 132*  BUN 13 9  CREATININE 1.07 1.01  CALCIUM 9.3 9.1  GFRNONAA >60 >60  GFRAA >60 >60  ANIONGAP 9 8     Hematology Recent Labs  Lab 12/05/19 2133 12/06/19 0230 12/07/19 0351  WBC 5.7 7.7 8.5  RBC 5.01 4.96 4.77  HGB 13.7 13.3 12.8*  HCT 41.3 40.7 39.3  MCV 82.4 82.1 82.4  MCH 27.3 26.8 26.8  MCHC 33.2 32.7 32.6  RDW 15.8* 15.7* 15.9*  PLT 310 303 281    BNP Recent Labs  Lab 12/05/19 2319  BNP 21.9     DDimer  Recent Labs  Lab 12/05/19 2322  DDIMER 0.54*     Radiology    DG Chest 2 View  Result Date: 12/05/2019 CLINICAL DATA:  Upper chest pain, shortness of breath EXAM: CHEST - 2 VIEW COMPARISON:  None. FINDINGS: Heart and mediastinal contours are within normal limits. No focal opacities or effusions. No acute bony abnormality. IMPRESSION: No active cardiopulmonary disease. Electronically Signed   By: Charlett Nose M.D.   On: 12/05/2019 21:44   CARDIAC CATHETERIZATION  Result Date: 12/06/2019  2nd Mrg lesion is 75% stenosed.  Balloon angioplasty was performed using a BALLOON SAPPHIRE 2.5X15.  Post intervention, there is a 40% residual stenosis. Even after a stent was placed across the ostium, there was TIMI-3 flow at the end of the procedure.  Prox Cx lesion is 100% stenosed. This was the culprit lesion.  After aspiration thrombectomy, A drug-eluting stent was  successfully placed using a STENT RESOLUTE ONYX 2.5X30. This was postdilated with a 3.0 Mathis balloon.  Post intervention, there is a 0% residual stenosis.  The left ventricular systolic function is normal, with mild anterolateral hypokinesis.  LV end diastolic pressure is mildly elevated. LVEDP 17 mmHg.  The left ventricular ejection fraction is 50-55% by visual estimate.  There is no aortic valve stenosis.  We will watch in ICU.  He will need aggressive secondary prevention including smoking cessation, blood pressure management, regular exercise and healthy diet.  We will hold his amlodipine and start ACE inhibitor.  Start high-dose statin.  Start beta-blocker as well.   ECHOCARDIOGRAM COMPLETE  Result Date: 12/06/2019    ECHOCARDIOGRAM REPORT   Patient Name:   Matthew Mccarty Date of Exam: 12/06/2019 Medical Rec #:  [id number]   Height:       70.0 in Accession #:    [id number]  Weight:       205.0 lb Date of Birth:  07-07-1966   BSA:          2.109 m Patient Age:    53 years    BP:           118/78 mmHg Patient Gender: M           HR:           62 bpm. Exam Location:  Inpatient Procedure: 2D Echo and Color Doppler Indications:    121-121.4 ST elevation (STEMI) and non-ST elevation (NSTEMI)                 nyocardial infarction  History:        Patient has no prior history of Echocardiogram examinations.                 Acute MI, Signs/Symptoms:Chest Pain; Risk Factors:Current Smoker                 and Dyslipidemia.  Sonographer:    Lavenia Atlas Referring Phys: [id number] Lonie Peak IMPRESSIONS  1. Left ventricular ejection fraction, by estimation, is 45 to 50%. The left ventricle has mildly decreased function. The left ventricle demonstrates regional wall motion abnormalities (see scoring diagram/findings for description). Left ventricular diastolic parameters are indeterminate. There is mild hypokinesis of the left ventricular, entire inferolateral wall.  2. Right ventricular systolic function is normal.  The right ventricular size is normal.  3. The mitral valve is normal in structure. Trivial mitral valve regurgitation. No evidence of mitral stenosis.  4. The aortic valve is tricuspid.  Aortic valve regurgitation is not visualized. No aortic stenosis is present.  5. The inferior vena cava is normal in size with greater than 50% respiratory variability, suggesting right atrial pressure of 3 mmHg. Comparison(s): No prior Echocardiogram. Conclusion(s)/Recommendation(s): Hypokinesis of inferolateral wall with mildly reduced EF. FINDINGS  Left Ventricle: Left ventricular ejection fraction, by estimation, is 45 to 50%. The left ventricle has mildly decreased function. The left ventricle demonstrates regional wall motion abnormalities. Mild hypokinesis of the left ventricular, entire inferolateral wall. The left ventricular internal cavity size was normal in size. There is borderline concentric left ventricular hypertrophy. Left ventricular diastolic parameters are indeterminate. Right Ventricle: The right ventricular size is normal. No increase in right ventricular wall thickness. Right ventricular systolic function is normal. Left Atrium: Left atrial size was normal in size. Right Atrium: Right atrial size was normal in size. Pericardium: Trivial pericardial effusion is present. Presence of pericardial fat pad. Mitral Valve: The mitral valve is normal in structure. Trivial mitral valve regurgitation. No evidence of mitral valve stenosis. Tricuspid Valve: The tricuspid valve is normal in structure. Tricuspid valve regurgitation is trivial. No evidence of tricuspid stenosis. Aortic Valve: The aortic valve is tricuspid. Aortic valve regurgitation is not visualized. No aortic stenosis is present. Pulmonic Valve: The pulmonic valve was grossly normal. Pulmonic valve regurgitation is not visualized. No evidence of pulmonic stenosis. Aorta: The aortic root, ascending aorta and aortic arch are all structurally normal, with no  evidence of dilitation or obstruction. Venous: The inferior vena cava is normal in size with greater than 50% respiratory variability, suggesting right atrial pressure of 3 mmHg. IAS/Shunts: No atrial level shunt detected by color flow Doppler.  LEFT VENTRICLE PLAX 2D LVIDd:         5.00 cm  Diastology LVIDs:         3.90 cm  LV e' lateral:   9.03 cm/s LV PW:         1.20 cm  LV E/e' lateral: 7.4 LV IVS:        1.30 cm  LV e' medial:    8.05 cm/s LVOT diam:     2.30 cm  LV E/e' medial:  8.2 LV SV:         80 LV SV Index:   38 LVOT Area:     4.15 cm  RIGHT VENTRICLE RV Basal diam:  2.50 cm RV S prime:     9.32 cm/s TAPSE (M-mode): 2.8 cm LEFT ATRIUM             Index       RIGHT ATRIUM           Index LA diam:        3.50 cm 1.66 cm/m  RA Area:     12.00 cm LA Vol (A2C):   54.3 ml 25.74 ml/m RA Volume:   27.80 ml  13.18 ml/m LA Vol (A4C):   49.6 ml 23.51 ml/m LA Biplane Vol: 53.6 ml 25.41 ml/m  AORTIC VALVE LVOT Vmax:   97.30 cm/s LVOT Vmean:  56.300 cm/s LVOT VTI:    0.192 m  AORTA Ao Root diam: 3.30 cm MITRAL VALVE MV Area (PHT): 2.50 cm    SHUNTS MV Decel Time: 303 msec    Systemic VTI:  0.19 m MV E velocity: 66.40 cm/s  Systemic Diam: 2.30 cm MV A velocity: 73.70 cm/s MV E/A ratio:  0.90 Buford Dresser MD Electronically signed by Buford Dresser MD Signature Date/Time: 12/06/2019/1:09:43 PM    Final  Cardiac Studies   Ejection fraction 45 to 50%  Patient Profile     54 y.o. male with CAD, MI, hypertension, tobacco abuse, chronic pain  Assessment & Plan    CAD/MI: He needs dual antiplatelet therapy.  Saw the note from the case manager.  What we may have to do is get him 30 days of Brilinta for free.  Then after 30 days he will take clopidogrel 75 mg daily.  He will need records that he can bring to [place].  I stressed the importance of compliance with his antiplatelet therapy to prevent stent thrombosis.  Hyperlipidemia: Continue high-dose statin.  Hypertension: Continue  lisinopril.  Bmet this morning is pending.  Well-controlled.  ACE inhibitor is a better choice than his amlodipine due to recent MI.  Tobacco abuse: Continue nicotine patches.  Plan for discharge later today.  For questions or updates, please contact [facility] Please consult [email or web address] for contact info under        Signed, Lance Muss, MD  12/07/2019, 9:18 AM

## 2019-12-07 NOTE — TOC Initial Note (Signed)
Transition of Care [facility]) - Initial/Assessment Note    Patient Details  Name: Matthew Mccarty MRN: [id number] Date of Birth: Jan 21, 1966  [facility]) CM/SW Contact:    Curlene Labrum, RN Phone Number: 12/07/2019, 11:14 AM  Clinical Narrative:                 Case management met with the patient at the bedside with the physician and the patient will be given 30 day supply of Brilinta.  He was also given the Brilinta application as well for financial assistance since the patient does not have insurance.  Dr. Irish Lack is planning on switching the patient to Plavix for more affordability.  The patient is currently visiting from [place], [place] and is staying with his daughter in [place], but will be leaving back for [place] after Mother's Day.  Patient's PCP is named Dr. Ina Homes in [place], [place].   Will continue to follow the patient until discharge.  Expected Discharge Plan: Home/Self Care Barriers to Discharge: Continued Medical Work up   Patient Goals and CMS Choice Patient states their goals for this hospitalization and ongoing recovery are:: I'm just visiting family here in [place], [place] but I'm planning on getting my heart better.      Expected Discharge Plan and Services Expected Discharge Plan: Home/Self Care                                              Prior Living Arrangements/Services   Lives with:: Self Patient language and need for interpreter reviewed:: Yes        Need for Family Participation in Patient Care: Yes (Comment)     Criminal Activity/Legal Involvement Pertinent to Current Situation/Hospitalization: No - Comment as needed  Activities of Daily Living Home Assistive Devices/Equipment: Cane (specify quad or straight) ADL Screening (condition at time of admission) Patient's cognitive ability adequate to safely complete daily activities?: Yes Is the patient deaf or have difficulty hearing?: No Does the patient have difficulty  seeing, even when wearing glasses/contacts?: No Does the patient have difficulty concentrating, remembering, or making decisions?: No Patient able to express need for assistance with ADLs?: Yes Does the patient have difficulty dressing or bathing?: No Independently performs ADLs?: Yes (appropriate for developmental age) Does the patient have difficulty walking or climbing stairs?: No Weakness of Legs: Both Weakness of Arms/Hands: Both  Permission Sought/Granted         Permission granted to share info w AGENCY: [facility]  pharmacy  Permission granted to share info w Relationship: family     Emotional Assessment Appearance:: Appears stated age Attitude/Demeanor/Rapport: Gracious Affect (typically observed): Accepting Orientation: : Oriented to Self, Oriented to Place, Oriented to  Time, Oriented to Situation Alcohol / Substance Use: Not Applicable Psych Involvement: No (comment)  Admission diagnosis:  ST elevation myocardial infarction (STEMI), unspecified artery (HCC) [I21.3] Elevated blood pressure reading with diagnosis of hypertension [I10] Acute MI, lateral wall (HCC) [I21.29] STEMI (ST elevation myocardial infarction) [facility]) [I21.3] Patient Active Problem List   Diagnosis Date Noted  . STEMI (ST elevation myocardial infarction) ([place]) 12/06/2019  . Hypertension 12/06/2019  . Acute MI, lateral wall ([place]) 12/06/2019   PCP:  Patient, No Pcp Per Pharmacy:   [place], [place] - [place] [address] [place] [address] Phone: [phone] Fax: [phone]  [id number] [place], [place] [address] [place] [address] Phone: [phone] Fax: [phone]  [facility] [place], [place] [address] [place] [address] Phone: [phone] Fax: [phone]     Social  Determinants of Health (SDOH) Interventions    Readmission Risk Interventions Readmission Risk Prevention Plan 12/07/2019  Post Dischage Appt Complete  Medication Screening Complete  Transportation Screening Complete  Some recent data might be hidden

## 2019-12-07 NOTE — Discharge Instructions (Signed)
PLEASE REMEMBER TO BRING ALL OF YOUR MEDICATIONS TO EACH OF YOUR FOLLOW-UP OFFICE VISITS.  PLEASE ATTEND ALL SCHEDULED FOLLOW-UP APPOINTMENTS.   Activity: Increase activity slowly as tolerated. You may shower, but no soaking baths (or swimming) for 1 week. No driving for 24 hours. No lifting over 5 lbs for 1 week. No sexual activity for 1 week.   You May Return to Work: in 1 week (if applicable)  Wound Care: You may wash cath site gently with soap and water. Keep cath site clean and dry. If you notice pain, swelling, bleeding or pus at your cath site, please call [phone].  ___________________________________________________________________________  It is VERY IMPORTANT that you take your aspirin and brilinta as prescribed without missing doses. Missing doses of these medications puts you at risk of forming a blockage in your stent which can lead to another heart attack.   You received a free 30 day supply of brilinta today. The plan is to switch Brilinta to plavix (clopidogrel) after 1 month due to the out of pocket cost of brilinta. You will need to obtain a prescription for plavix (clopidogrel) at your follow-up visit with Tereso Newcomer 12/14/19.  ____________________________________________________________________________  FOLLOW-UP in [place]: Please look into finding a cardiologist to follow with in [facility] for when you return home. Would like for you to have a visit arranged to be seen within the next 1-2 months. Please let us know who you will be seeing and when you will be seen at your outpatient follow-up appointment with our group [facility]).

## 2019-12-08 NOTE — Telephone Encounter (Signed)
**Note De-Identified Matthew Mccarty Obfuscation** Patient contacted regarding discharge from [facility] on 12/07/2019.  Patient understands to follow up with provider Tereso Newcomer, PA-c on 12/14/2019 at 2:15 at [address], [address] in [place], [place] [address].  I did provide the pt with verbal directions as well. Patient understands discharge instructions? Yes Patient understands medications and regiment? Yes Patient understands to bring all medications to this visit? Yes

## 2019-12-09 ENCOUNTER — Telehealth (HOSPITAL_COMMUNITY): Payer: Self-pay

## 2019-12-09 NOTE — Telephone Encounter (Signed)
Attempted to call patient in regards to Cardiac Rehab - LM on VM 

## 2019-12-14 ENCOUNTER — Encounter: Payer: Self-pay | Admitting: Physician Assistant

## 2019-12-14 ENCOUNTER — Other Ambulatory Visit: Payer: Self-pay

## 2019-12-14 ENCOUNTER — Ambulatory Visit (INDEPENDENT_AMBULATORY_CARE_PROVIDER_SITE_OTHER): Payer: Self-pay | Admitting: Physician Assistant

## 2019-12-14 VITALS — BP 130/80 | HR 54 | Ht 70.0 in | Wt 214.4 lb

## 2019-12-14 DIAGNOSIS — I2121 ST elevation (STEMI) myocardial infarction involving left circumflex coronary artery: Secondary | ICD-10-CM

## 2019-12-14 DIAGNOSIS — Z720 Tobacco use: Secondary | ICD-10-CM

## 2019-12-14 DIAGNOSIS — I10 Essential (primary) hypertension: Secondary | ICD-10-CM

## 2019-12-14 DIAGNOSIS — I255 Ischemic cardiomyopathy: Secondary | ICD-10-CM

## 2019-12-14 DIAGNOSIS — E782 Mixed hyperlipidemia: Secondary | ICD-10-CM

## 2019-12-14 MED ORDER — CLOPIDOGREL BISULFATE 75 MG PO TABS
ORAL_TABLET | ORAL | 2 refills | Status: DC
Start: 2019-12-14 — End: 2019-12-31

## 2019-12-14 NOTE — Patient Instructions (Signed)
Medication Instructions:   We will check into Brilinta assistance, if we can not get assistance, you will start Plavix (Clopidogrel) the day after your last Brilinta dose.  Lab Work:  You will have labs drawn today: BMET  Testing/Procedures:  None ordered today  Follow-Up:  Establish care with a cardiologist in [place]

## 2019-12-14 NOTE — Progress Notes (Signed)
Cardiology Office Note:    Date:  12/14/2019   ID:  Rica Mote, DOB [birth year], MRN [id number]  PCP:  Patient, No Pcp Per  Cardiologist:  Larae Grooms, MD  Electrophysiologist:  None   Referring MD: No ref. provider found   Chief Complaint:  Hospitalization Follow-up (s/p MI >> PCI)    Patient Profile:    Matthew Mccarty is a 54 y.o. male with:   Coronary artery disease  S/p lateral STEMI 12/2019 >> PCI: DES to the LCx, POBA to OM2  Hypertension  Hyperlipidemia  Tobacco abuse  Prior CV studies: Echocardiogram 12/06/2019 EF 45-50, inferolateral HK, normal RV SF  Cardiac catheterization 12/06/2019 LAD irregularities with proximal ectasia LCx proximal 100; OM2 75 RCA irregularities EF 50-55 PCI: Thrombectomy and 2.5 x 30 mm Resolute Onyx DES to the proximal LCx; POBA to the OM2      History of Present Illness:    Matthew Mccarty was admitted 5/3-5/5 with a lateral STEMI.  Cardiac catheterization demonstrated thrombotic occlusion of the proximal LCx as well as high-grade stenosis in OM 2.  LCx was treated with a DES and aspiration thrombectomy and the OM2 was treated with balloon angioplasty.  EF was 45-50 by echocardiogram.  Beta-blocker, ACE inhibitor were initiated.  Discharge notes indicate that cost of Ticagrelor is prohibitive.  He was given a prescription card so that he can complete 30 days of Ticagrelor.  He would then need to transition to clopidogrel with a loading dose.  Of note, he is visiting from [place] and will return in approximately 1 month.   He returns for posthospitalization follow-up.  He is here alone.  He has had some chest pains on the right side and left side of his chest.  He has pain with palpation as well as with certain movements.  Of note, he did take 1 nitroglycerin recently and he felt that this improved his symptoms.  However, he has not had any recurrent symptoms reminiscent of his previous angina.  He does note shortness of breath on  occasion.  He has not had orthopnea, significant lower extremity swelling or syncope.  He has not had pleuritic chest symptoms.  He has not had chest pain with lying supine.  Past Medical History:  Diagnosis Date  . CAD (coronary artery disease)    S/p lateral STEMI 12/2019 >> PCI: DES to the LCx, POBA to OM2  . HLD (hyperlipidemia)   . Hypertension   . Ischemic cardiomyopathy    Echo 12/2019: EF 45-50, inferolateral HK, normal RV SF    Current Medications: Current Meds  Medication Sig  . aspirin EC 81 MG EC tablet Take 1 tablet (81 mg total) by mouth daily.  Marland Kitchen atorvastatin (LIPITOR) 80 MG tablet Take 1 tablet (80 mg total) by mouth daily.  Marland Kitchen lisinopril (ZESTRIL) 10 MG tablet Take 1 tablet (10 mg total) by mouth daily.  . metoprolol tartrate (LOPRESSOR) 25 MG tablet Take 0.5 tablets (12.5 mg total) by mouth 2 (two) times daily.  . nicotine (NICODERM CQ - DOSED IN MG/24 HOURS) 21 mg/24hr patch Place 1 patch (21 mg total) onto the skin daily.  . nitroGLYCERIN (NITROSTAT) 0.4 MG SL tablet Place 1 tablet (0.4 mg total) under the tongue every 5 (five) minutes x 3 doses as needed for chest pain.  . pantoprazole (PROTONIX) 40 MG tablet Take 1 tablet (40 mg total) by mouth daily.  . ticagrelor (BRILINTA) 90 MG TABS tablet Take 1 tablet (90 mg total) by mouth  2 (two) times daily.     Allergies:   Aspirin   Social History   Tobacco Use  . Smoking status: Current Every Day Smoker    Types: Cigarettes  . Smokeless tobacco: Never Used  Substance Use Topics  . Alcohol use: No  . Drug use: No     Family Hx: The patient's family history includes Cancer in his mother; Diabetes in his mother; Healthy in his father.  Review of Systems  Constitution: Negative for fever.  Respiratory: Negative for cough.   Gastrointestinal: Negative for hematochezia and melena.  Genitourinary: Negative for hematuria.     EKGs/Labs/Other Test Reviewed:    EKG:  EKG is  ordered today.  The ekg ordered today  demonstrates sinus bradycardia, HR 54, normal axis, nonspecific ST-T wave changes, QTC 407  Recent Labs: 12/05/2019: B Natriuretic Peptide 21.9 12/06/2019: Magnesium 2.0 12/07/2019: BUN 11; Creatinine, Ser 1.05; Hemoglobin 12.8; Platelets 281; Potassium 4.0; Sodium 138   Recent Lipid Panel Lab Results  Component Value Date/Time   CHOL 214 (H) 12/06/2019 02:30 AM   TRIG 27 12/06/2019 02:30 AM   HDL 45 12/06/2019 02:30 AM   CHOLHDL 4.8 12/06/2019 02:30 AM   LDLCALC 164 (H) 12/06/2019 02:30 AM    Physical Exam:    VS:  BP 130/80   Pulse (!) 54   Ht 5\' 10"  (1.778 m)   Wt 214 lb 6.4 oz (97.3 kg)   SpO2 96%   BMI 30.76 kg/m     Wt Readings from Last 3 Encounters:  12/14/19 214 lb 6.4 oz (97.3 kg)  12/07/19 206 lb 12.7 oz (93.8 kg)  11/04/19 221 lb 6.4 oz (100.4 kg)     Constitutional:      Appearance: Healthy appearance. Not in distress.  Neck:     Thyroid: No thyromegaly.     Vascular: JVD normal.  Pulmonary:     Effort: Pulmonary effort is normal.     Breath sounds: No wheezing. No rales.  Cardiovascular:     Normal rate. Regular rhythm. Normal S1. Normal S2.     Murmurs: There is no murmur.  Edema:    Peripheral edema absent.     Comments: R wrist without hematoma Abdominal:     Palpations: Abdomen is soft. There is no hepatomegaly.  Skin:    General: Skin is warm and dry.  Neurological:     General: No focal deficit present.     Mental Status: Alert and oriented to person, place and time.     Cranial Nerves: Cranial nerves are intact.       ASSESSMENT & PLAN:    1. ST elevation myocardial infarction involving left circumflex coronary artery (HCC) Status post recent lateral STEMI treated with a DES to the LCx and balloon angioplasty to the second obtuse marginal.  He had no significant disease elsewhere.  He is currently doing well without recurrent anginal symptoms.  He has had some occasional chest pains which are atypical for ischemia.  His ECG actually looks  improved since the hospital.  I suspect his pain is musculoskeletal.  He does have some shortness of breath which is likely related to Ticagrelor (Brilinta).  He does note that his Medicaid will not cover Ticagrelor.  I will have our nurse in the office check to see if we can get him assistance with the company.  I have also give him a prescription for clopidogrel (Plavix).  If he cannot get coverage for Ticagrelor, he will need to  transition to Plavix.  He will take 600 mg on day 1 and then 75 mg daily thereafter.  Continue aspirin, statin, beta-blocker, ACE inhibitor.  He will need to establish with cardiology when he returns to [place].  Once he is established, he can discuss cardiac rehabilitation with his cardiologist there.  He would like to stay established here as he does travel back to [place] for 2 to 3 months in the summertime.  I advised him to arrange follow-up from time to time when he is here visiting.  2. Essential hypertension Fair control.  Continue current dose of lisinopril and metoprolol.  Obtain follow-up BMP today.  3. Mixed hyperlipidemia Continue high intensity statin therapy.  He will need fasting lipids and LFTs obtained by his cardiologist in [place] when he returns.  4. Ischemic cardiomyopathy EF 45-50 by echocardiogram post MI.  He does have some shortness of breath but this seems to be related to Brilinta.  Volume status seems to be stable.  He is not currently on diuretic therapy.  If his blood pressure remains above target, consider adding spironolactone.   5. Tobacco abuse We discussed the importance of quitting.     Dispo:  Return follow up as needed with Dr. Eldridge Dace.   Medication Adjustments/Labs and Tests Ordered: Current medicines are reviewed at length with the patient today.  Concerns regarding medicines are outlined above.  Tests Ordered: Orders Placed This Encounter  Procedures  . Basic metabolic panel  . EKG 12-Lead   Medication  Changes: Meds ordered this encounter  Medications  . clopidogrel (PLAVIX) 75 MG tablet    Sig: Take 8 tablets (600 mg total) by mouth daily for 1 day, THEN 1 tablet (75 mg total) daily.    Dispense:  38 tablet    Refill:  2    Signed, Tereso Newcomer, PA-C  12/14/2019 3:06 PM    [facility] [address] [place], [place], [place]  [address] Phone: [phone]; Fax: [phone]

## 2019-12-15 LAB — BASIC METABOLIC PANEL
BUN/Creatinine Ratio: 10 (ref 9–20)
BUN: 11 mg/dL (ref 6–24)
CO2: 24 mmol/L (ref 20–29)
Calcium: 9.6 mg/dL (ref 8.7–10.2)
Chloride: 107 mmol/L — ABNORMAL HIGH (ref 96–106)
Creatinine, Ser: 1.11 mg/dL (ref 0.76–1.27)
GFR calc Af Amer: 87 mL/min/{1.73_m2} (ref >59)
GFR calc non Af Amer: 75 mL/min/{1.73_m2} (ref >59)
Glucose: 98 mg/dL (ref 65–99)
Potassium: 4.1 mmol/L (ref 3.5–5.2)
Sodium: 144 mmol/L (ref 134–144)

## 2019-12-16 ENCOUNTER — Telehealth (HOSPITAL_COMMUNITY): Payer: Self-pay

## 2019-12-16 NOTE — Telephone Encounter (Signed)
Called patient to see if she was interested in participating in the Cardiac Rehab Program. Patient stated yes. Patient will come in for orientation on 01/10/20 @ 930AM and will attend the VCR only.  Mailed letter.

## 2019-12-19 ENCOUNTER — Telehealth: Payer: Self-pay

## 2019-12-19 NOTE — Telephone Encounter (Signed)
**Note De-Identified Matthew Mccarty Obfuscation** I called the pt to discuss Pt Asst through [employer] ([place] [employer] [place]). He states that he plans to live in this area for "a while" so I gave him [place] [employer] [place] pt asst phone number to call with questions concerning their program as he does not have a printer at home. He is aware to ask them to mail him an application and that once he receives it to complete his part, obtain required documents per [place] [employer] [place], bring all to the office to drop off and that we will take care of the provider part and fax all to [place] [employer] [place].  He thanked me for calling him and is aware to call me if he has any questions for [place].

## 2019-12-19 NOTE — Telephone Encounter (Signed)
**Note De-identified Emani Morad Obfuscation** -----  **Note De-Identified Brylan Seubert Obfuscation** Message from Beatrice Lecher, [place] sent at 12/18/2019  7:31 AM EDT ----- Regarding: Ignacia Palma Can you see if we can get him approved for Brilinta? I gave him a paper Rx for Plavix just in case. He lives in [place] and was heading home soon.  Thanks! Scott

## 2019-12-31 ENCOUNTER — Telehealth (HOSPITAL_COMMUNITY): Payer: Self-pay | Admitting: Pharmacist

## 2020-01-05 NOTE — Telephone Encounter (Signed)
Cardiac Rehab Medication Review by a Pharmacist  Does the patient  feel that his/her medications are working for him/her?  Pt feels like his oxycodone, Brilinta, and aspirin are working but patient is not confident in any of the other medications. Patient threatening to stop taking metoprolol tartrate due to dizziness.  Has the patient been experiencing any side effects to the medications prescribed?  Yes - pt reports drowsiness with metoprolol and states that he frequently misses doses. He is also reporting some dyspnea with Brilinta and constipation with pantoprazole. Educated patient that side effects from metoprolol and Brilinta will improve over time but patient does not believe that he can wait. I also explained that drowsiness and constipation are likely more associated with oxycodone.  Does the patient measure his/her own blood pressure or blood glucose at home?  No patient does not measure at home. Recommended to consider purchasing a BP cuff to check BP at home and that his goal BP is <130/80.  Does the patient have any problems obtaining medications due to transportation or finances?   Yes - pt is insure if his Brilinta will be covered or not. Please make sure he is able to obtain.  Understanding of regimen: fair Understanding of indications: poor Potential of compliance: fair    Pharmacist comments: Patient was unfamiliar with what his medications are for. He reports missing doses of metoprolol and Brilinta. I have educated the patient on the importance of continuing these medications as they have demonstrated mortality benefit and prevents future heart attacks. The patient believes that the adverse effects are from his heart medications and contemplating on stopping them. I have educated that stopping them is dangerous and significantly increases risk of future heart attacks.    Alvia Grove, PharmD PGY1 Acute Care Pharmacy Resident 01/05/2020 4:40 PM

## 2020-01-06 ENCOUNTER — Telehealth (HOSPITAL_COMMUNITY): Payer: Self-pay

## 2020-01-06 NOTE — Telephone Encounter (Signed)
Cardiac Rehab Telephone Note:  Successful telephone encounter to Matthew Mccarty to confirm Cardiac Rehab orientation appointment for 01/10/20 at 9:30. Unfortunately patient states he will not be able to make appointment and wishes to reschedule. Patient informed that scheduling team would contact him to reschedule over the next 24 hours. Patient verbalized understanding. Appointment cancelled.  Maniah Nading E. Suzie Portela RN, BSN [place]  Cardiac and Pulmonary Rehabilitation Phone: [phone] Fax: [phone]

## 2020-01-10 ENCOUNTER — Ambulatory Visit (HOSPITAL_COMMUNITY): Payer: Self-pay

## 2020-01-13 ENCOUNTER — Telehealth (HOSPITAL_COMMUNITY): Payer: Self-pay

## 2020-01-13 ENCOUNTER — Encounter (HOSPITAL_COMMUNITY): Payer: Self-pay

## 2020-01-13 NOTE — Telephone Encounter (Signed)
Attempted to call patient in regards to Cardiac Rehab - LM on VM Mailed letter 

## 2020-01-30 ENCOUNTER — Telehealth (HOSPITAL_COMMUNITY): Payer: Self-pay

## 2020-01-30 NOTE — Telephone Encounter (Signed)
No response from pt regarding CR.  Closed referral.  

## 2020-06-10 IMAGING — CR DG CHEST 2V
2 series · 2 of 2 positions shown · non-contrast
Comparison: None.

CLINICAL DATA: Upper chest pain, shortness of breath

EXAM:
CHEST - 2 VIEW

[w chest pa]
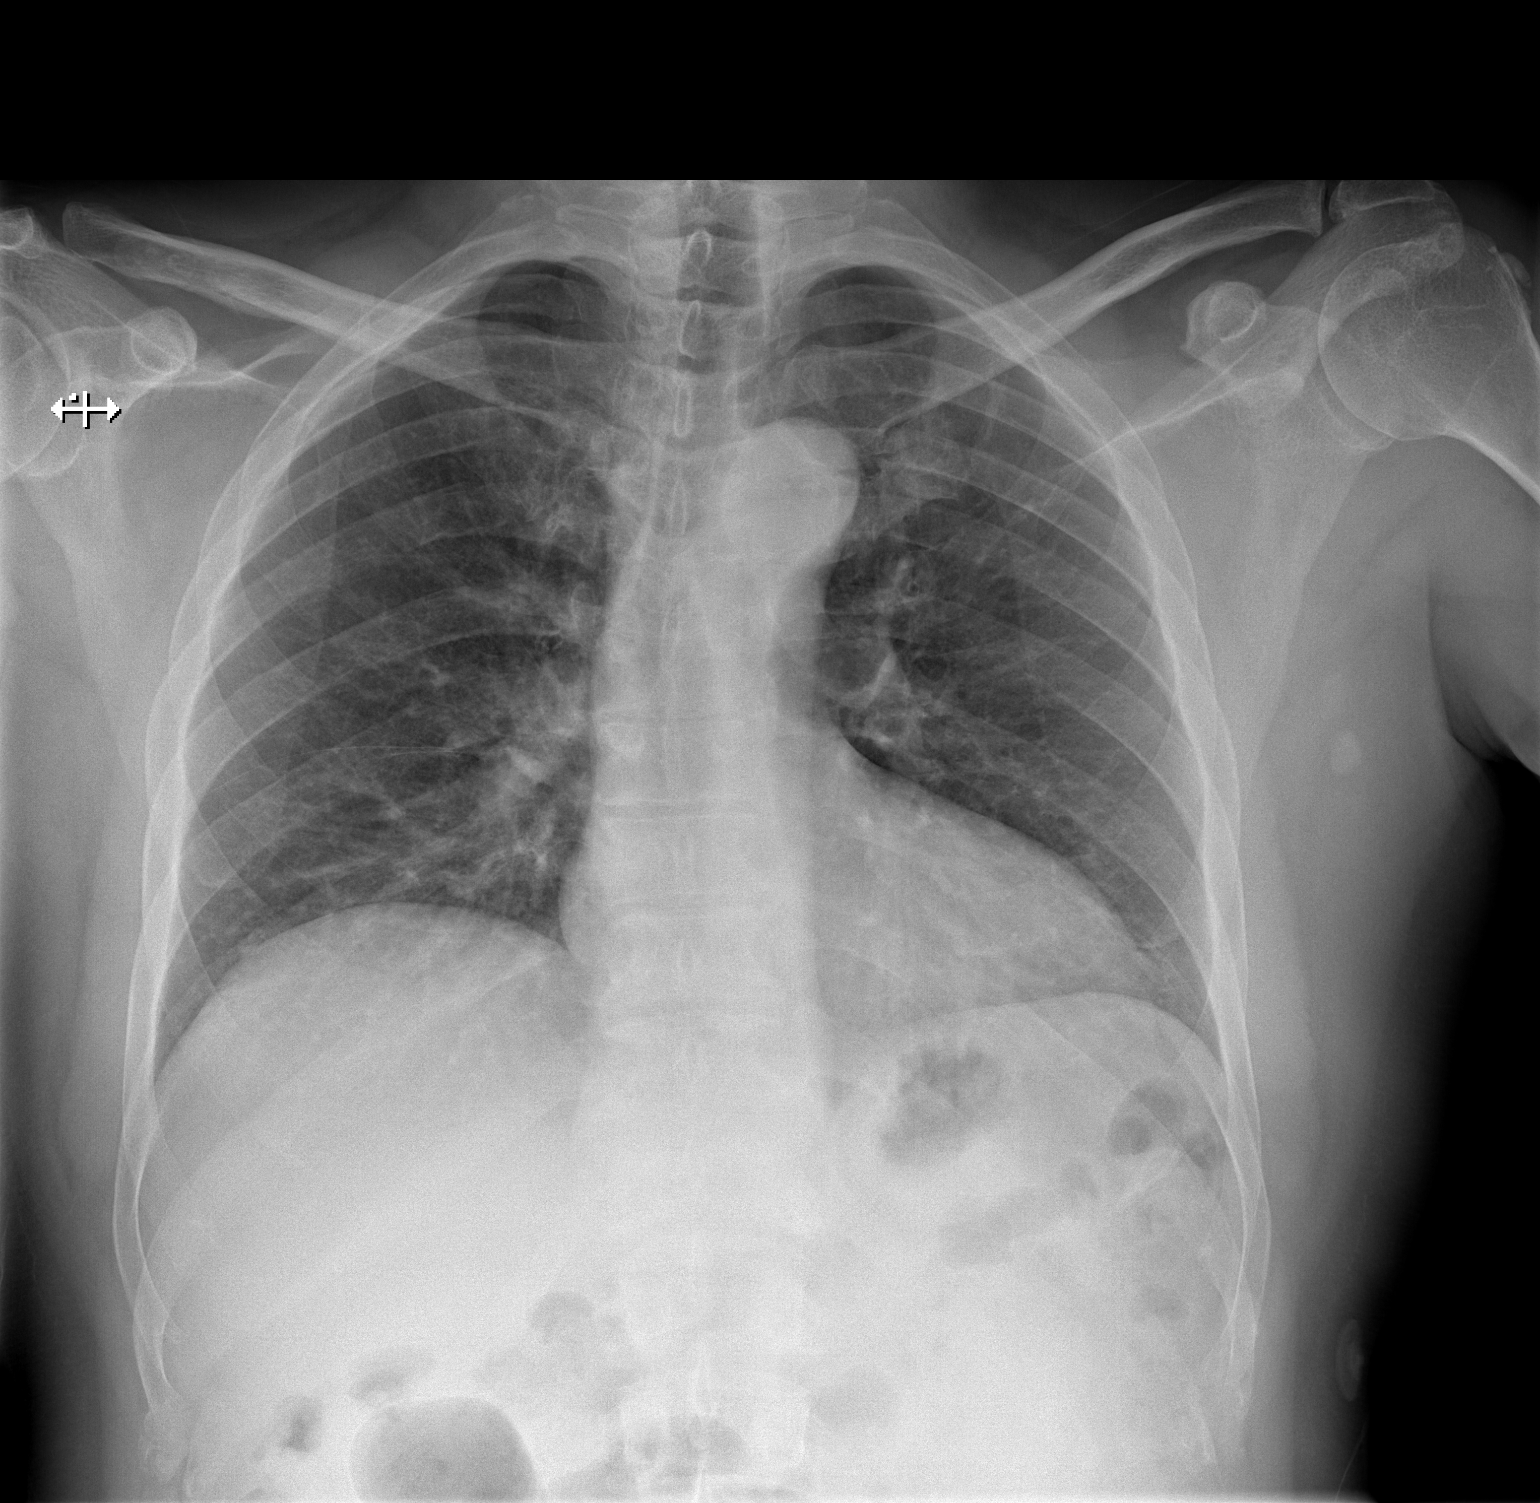

[w chest lat]
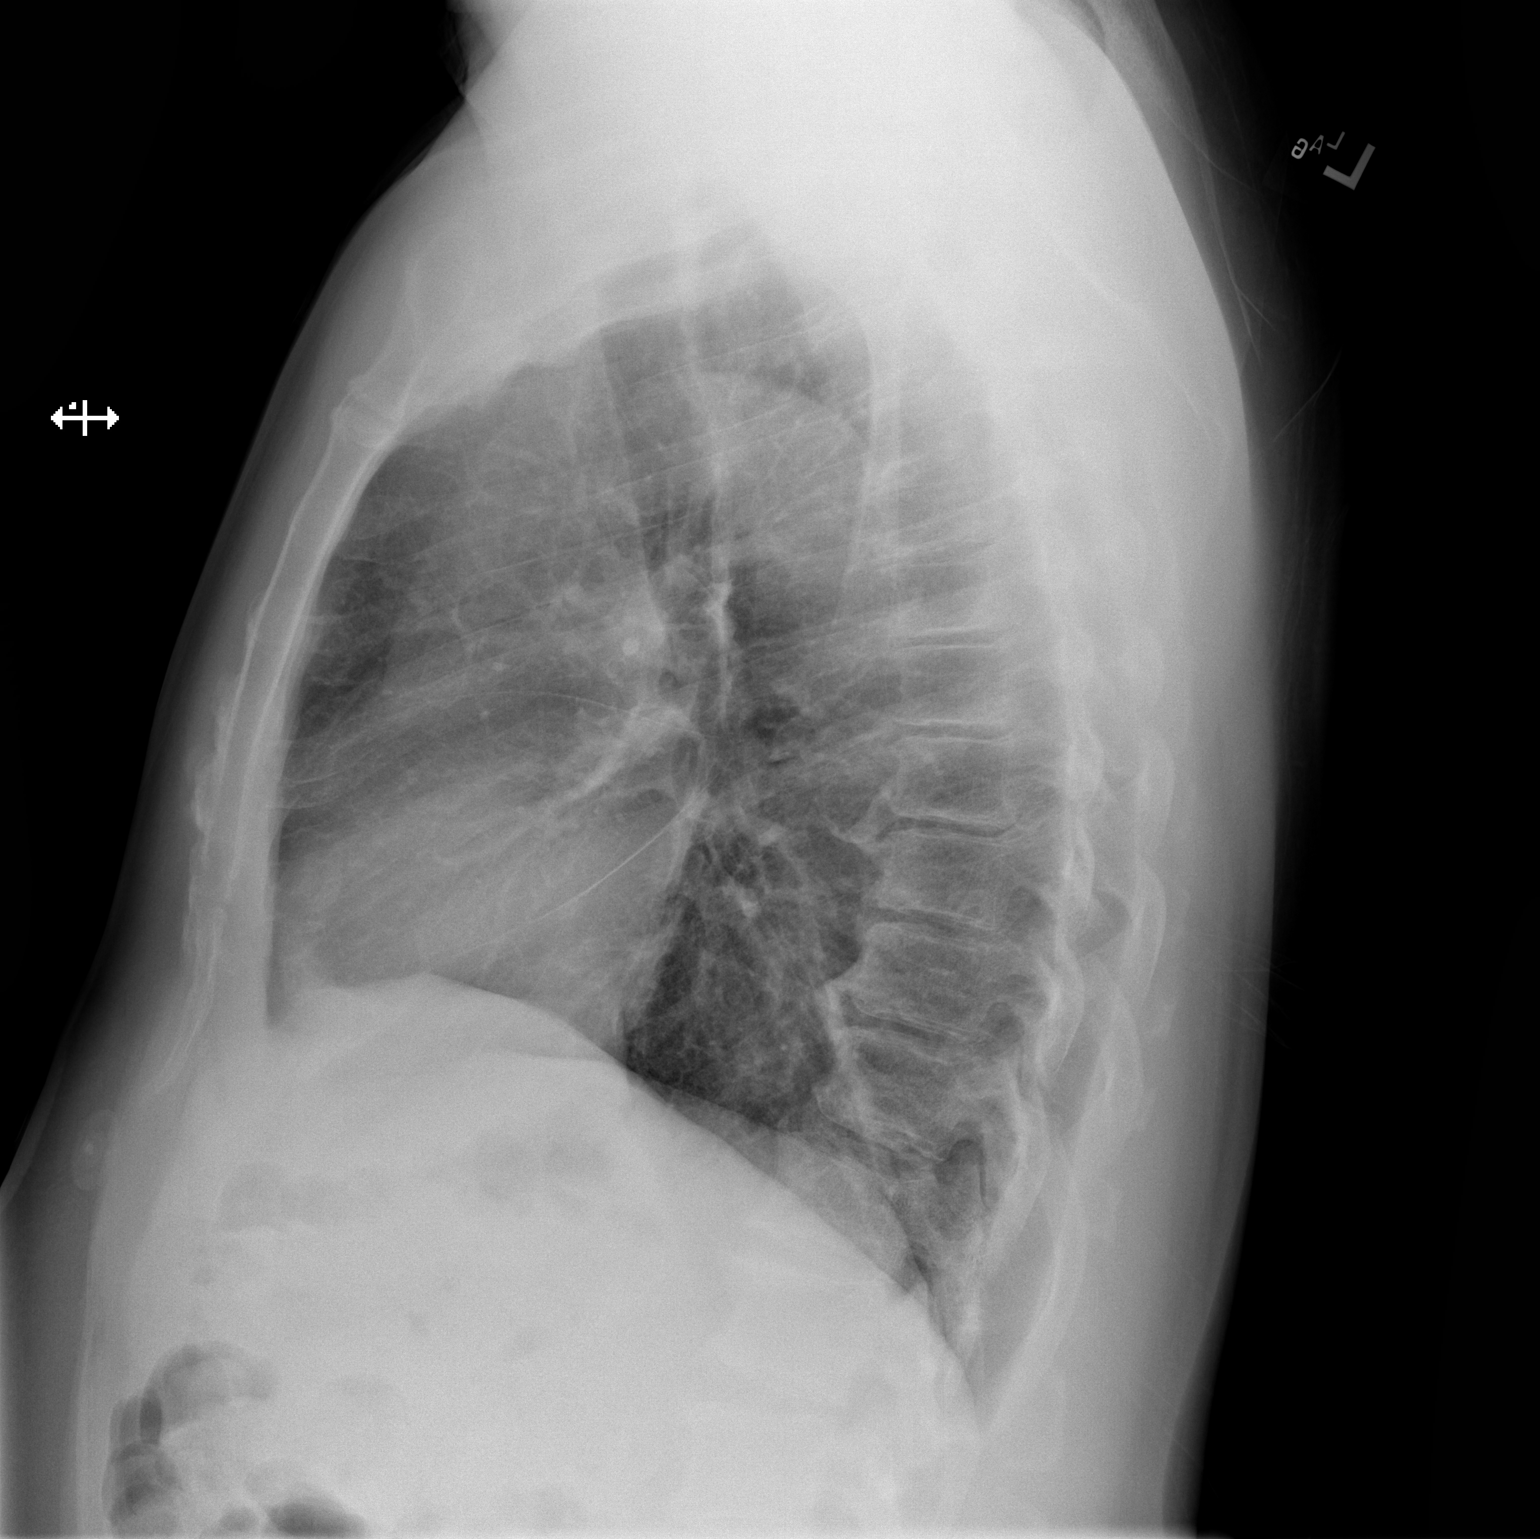

[2 of 2 positions shown; findings below may reference images not displayed]

FINDINGS: Heart and mediastinal contours are within normal limits. No focal
opacities or effusions. No acute bony abnormality.
IMPRESSION: No active cardiopulmonary disease.

## 2020-11-29 ENCOUNTER — Other Ambulatory Visit: Payer: Self-pay

## 2020-11-29 ENCOUNTER — Encounter (HOSPITAL_COMMUNITY): Payer: Self-pay | Admitting: Emergency Medicine

## 2020-11-29 ENCOUNTER — Ambulatory Visit (HOSPITAL_COMMUNITY)
Admission: EM | Admit: 2020-11-29 | Discharge: 2020-11-29 | Disposition: A | Discharge status: Home / Self Care | Payer: Self-pay | Attending: Emergency Medicine | Admitting: Emergency Medicine

## 2020-11-29 DIAGNOSIS — Z20822 Contact with and (suspected) exposure to covid-19: Secondary | ICD-10-CM | POA: Insufficient documentation

## 2020-11-29 DIAGNOSIS — R0981 Nasal congestion: Secondary | ICD-10-CM | POA: Insufficient documentation

## 2020-11-29 LAB — SARS CORONAVIRUS 2 (TAT 6-24 HRS): SARS Coronavirus 2: NEGATIVE

## 2020-11-29 MED ORDER — FLUTICASONE PROPIONATE 50 MCG/ACT NA SUSP: 2.0000 | Freq: Every day | NASAL | 0 refills | Status: AC

## 2020-11-29 NOTE — ED Triage Notes (Signed)
Itchy throat started 2 days ago.  Reports head congestion.  Patient has a productive cough-reports clear phlegm.  Patient has a headache and bodyache.
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
## 2020-11-29 NOTE — Discharge Instructions (Addendum)
Try an antihistamine such as Claritin, Zyrtec, or Allegra.  If this does not work, then discontinue it and switch to [employer].  Flonase for the nasal congestion, saline nasal irrigation with a [facility] Med rinse and distilled water as often as you want for the nasal congestion.  I will also reduce this postnasal drip.  You can may take 1000 mg of Tylenol 3-4 times a day as needed for headache, body aches, etc.  COVID test will be back in 6 to 24 hours.  We will contact you if it is positive.

## 2024-02-05 ENCOUNTER — Encounter (HOSPITAL_COMMUNITY): Payer: Self-pay

## 2024-02-05 ENCOUNTER — Ambulatory Visit (HOSPITAL_COMMUNITY): Admission: EM | Admit: 2024-02-05 | Discharge: 2024-02-05 | Disposition: A | Discharge status: Home / Self Care

## 2024-02-05 DIAGNOSIS — M5441 Lumbago with sciatica, right side: Secondary | ICD-10-CM | POA: Diagnosis not present

## 2024-02-05 MED ORDER — ACETAMINOPHEN 500 MG PO TABS: 500.0000 mg | ORAL_TABLET | Freq: Four times a day (QID) | ORAL | 0 refills | Status: AC | PRN

## 2024-02-05 MED ORDER — DEXAMETHASONE SODIUM PHOSPHATE 10 MG/ML IJ SOLN
10.0000 mg | Freq: Once | INTRAMUSCULAR | Status: AC
  Administered 2024-02-05: 10 mg via INTRAMUSCULAR

## 2024-02-05 MED ORDER — DEXAMETHASONE SODIUM PHOSPHATE 10 MG/ML IJ SOLN
INTRAMUSCULAR | Status: AC
  Filled 2024-02-05: qty 1

## 2024-02-05 MED ORDER — LIDOCAINE 4 % EX PTCH: 1.0000 | MEDICATED_PATCH | CUTANEOUS | 0 refills | Status: AC

## 2024-02-05 NOTE — Discharge Instructions (Signed)
 You were given an injection of Decadron  in clinic today which is a steroid to help with inflammation causing your pain. Take 500 mg of Tylenol  every 6 hours as needed for pain. Apply lidocaine  patch for 12 hours at a time once daily for additional pain relief. Alternate between ice and heat as needed for pain. With [place] sports [facility] if your pain continues. Follow-up with your primary care provider or return here as needed.

## 2024-02-05 NOTE — ED Triage Notes (Signed)
 Patient c/o pain to right buttocks that radiates down right leg x 1 week.  Home interventions: none
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
## 2024-03-08 ENCOUNTER — Ambulatory Visit: Admitting: Physician Assistant

## 2024-03-08 VITALS — BP 132/75 | HR 59 | Ht 70.0 in | Wt 204.0 lb

## 2024-03-08 DIAGNOSIS — G8929 Other chronic pain: Secondary | ICD-10-CM

## 2024-03-08 DIAGNOSIS — R519 Headache, unspecified: Secondary | ICD-10-CM | POA: Diagnosis not present

## 2024-03-08 NOTE — Patient Instructions (Signed)
 VISIT SUMMARY:  Matthew Mccarty, a 58 year old male with hypertension and coronary artery disease, came in today due to experiencing intermittent headaches. He suspects that his headaches may be related to his elevated blood pressure, missed medication doses, reduced oxycodone  intake, and dehydration from not drinking enough water. He has a history of stent placement and regularly sees a neurologist and cardiologist.  YOUR PLAN:  -HEADACHE: Your intermittent headaches may be due to missed doses of your medication [place], dehydration, and reducing your oxycodone  intake. Please ensure you are drinking enough water throughout the day.  -CHRONIC PAIN REQUIRING OPIOID THERAPY: You are managing chronic pain with oxycodone  and are trying to reduce your intake to avoid addiction. Reducing your oxycodone  intake may cause withdrawal symptoms, which could be contributing to your headaches.  INSTRUCTIONS:  Please ensure you are drinking enough water throughout the day to stay hydrated. Monitor your blood pressure at home regularly and try not to miss any doses of your medication. If your headaches persist or worsen, please follow up with your neurologist or cardiologist.
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
## 2024-03-09 ENCOUNTER — Encounter: Payer: Self-pay | Admitting: Physician Assistant
